# Patient Record
Sex: Male | Born: 1970
Health system: Southern US, Community
[De-identification: ages and names within clinical notes are randomized; demographics above are authoritative.]

## PROBLEM LIST (undated history)

## (undated) DIAGNOSIS — R079 Chest pain, unspecified: Secondary | ICD-10-CM

## (undated) DIAGNOSIS — K208 Other esophagitis: Secondary | ICD-10-CM

## (undated) DIAGNOSIS — K219 Gastro-esophageal reflux disease without esophagitis: Secondary | ICD-10-CM

## (undated) DIAGNOSIS — E669 Obesity, unspecified: Secondary | ICD-10-CM

## (undated) DIAGNOSIS — N529 Male erectile dysfunction, unspecified: Secondary | ICD-10-CM

## (undated) DIAGNOSIS — G4733 Obstructive sleep apnea (adult) (pediatric): Secondary | ICD-10-CM

## (undated) DIAGNOSIS — E119 Type 2 diabetes mellitus without complications: Secondary | ICD-10-CM

## (undated) DIAGNOSIS — E785 Hyperlipidemia, unspecified: Secondary | ICD-10-CM

## (undated) DIAGNOSIS — I10 Essential (primary) hypertension: Secondary | ICD-10-CM

## (undated) DIAGNOSIS — G8929 Other chronic pain: Secondary | ICD-10-CM

## (undated) HISTORY — DX: Male erectile dysfunction, unspecified: N52.9

## (undated) HISTORY — DX: Type 2 diabetes mellitus without complications: E11.9

## (undated) HISTORY — DX: Chest pain, unspecified: R07.9

## (undated) HISTORY — DX: Gastro-esophageal reflux disease without esophagitis: K21.9

## (undated) HISTORY — DX: Hyperlipidemia, unspecified: E78.5

## (undated) HISTORY — DX: Obstructive sleep apnea (adult) (pediatric): G47.33

## (undated) HISTORY — DX: Other chronic pain: G89.29

## (undated) HISTORY — PX: ANTERIOR CRUCIATE LIGAMENT REPAIR: SHX115

## (undated) HISTORY — DX: Other esophagitis: K20.8

## (undated) HISTORY — DX: Obesity, unspecified: E66.9

## (undated) HISTORY — DX: Essential (primary) hypertension: I10

---

## 2000-12-30 ENCOUNTER — Emergency Department (HOSPITAL_COMMUNITY): Admission: EM | Admit: 2000-12-30 | Discharge: 2000-12-30 | Payer: Self-pay | Admitting: Emergency Medicine

## 2000-12-30 ENCOUNTER — Encounter: Payer: Self-pay | Admitting: Emergency Medicine

## 2001-01-10 ENCOUNTER — Encounter: Payer: Self-pay | Admitting: Internal Medicine

## 2001-01-16 ENCOUNTER — Ambulatory Visit (HOSPITAL_COMMUNITY): Admission: RE | Admit: 2001-01-16 | Discharge: 2001-01-16 | Payer: Self-pay | Admitting: Gastroenterology

## 2001-03-07 ENCOUNTER — Encounter: Payer: Self-pay | Admitting: Gastroenterology

## 2001-03-07 ENCOUNTER — Ambulatory Visit (HOSPITAL_COMMUNITY): Admission: RE | Admit: 2001-03-07 | Discharge: 2001-03-07 | Payer: Self-pay | Admitting: Gastroenterology

## 2001-03-13 ENCOUNTER — Emergency Department (HOSPITAL_COMMUNITY): Admission: EM | Admit: 2001-03-13 | Discharge: 2001-03-13 | Payer: Self-pay | Admitting: Emergency Medicine

## 2001-03-15 ENCOUNTER — Ambulatory Visit (HOSPITAL_COMMUNITY): Admission: RE | Admit: 2001-03-15 | Discharge: 2001-03-15 | Payer: Self-pay | Admitting: *Deleted

## 2001-03-15 ENCOUNTER — Encounter (INDEPENDENT_AMBULATORY_CARE_PROVIDER_SITE_OTHER): Payer: Self-pay | Admitting: Specialist

## 2001-06-29 ENCOUNTER — Encounter: Payer: Self-pay | Admitting: Internal Medicine

## 2001-06-29 ENCOUNTER — Ambulatory Visit (HOSPITAL_COMMUNITY): Admission: RE | Admit: 2001-06-29 | Discharge: 2001-06-29 | Payer: Self-pay | Admitting: Internal Medicine

## 2001-11-05 ENCOUNTER — Encounter (INDEPENDENT_AMBULATORY_CARE_PROVIDER_SITE_OTHER): Payer: Self-pay | Admitting: Internal Medicine

## 2001-11-05 ENCOUNTER — Ambulatory Visit (HOSPITAL_COMMUNITY): Admission: RE | Admit: 2001-11-05 | Discharge: 2001-11-05 | Payer: Self-pay | Admitting: Internal Medicine

## 2005-03-01 ENCOUNTER — Ambulatory Visit: Payer: Self-pay | Admitting: Cardiology

## 2005-03-09 ENCOUNTER — Ambulatory Visit: Payer: Self-pay | Admitting: Cardiology

## 2005-03-10 ENCOUNTER — Ambulatory Visit: Payer: Self-pay | Admitting: Cardiology

## 2006-03-09 HISTORY — PX: CHOLECYSTECTOMY: SHX55

## 2006-07-03 ENCOUNTER — Ambulatory Visit: Payer: Self-pay | Admitting: Internal Medicine

## 2006-07-03 LAB — CONVERTED CEMR LAB
ALT: 132 units/L — ABNORMAL HIGH (ref 0–40)
AST: 76 units/L — ABNORMAL HIGH (ref 0–37)
Albumin: 4.1 g/dL (ref 3.5–5.2)
Alkaline Phosphatase: 119 units/L — ABNORMAL HIGH (ref 39–117)
BUN: 10 mg/dL (ref 6–23)
Basophils Absolute: 0 10*3/uL (ref 0.0–0.1)
Basophils Relative: 0.6 % (ref 0.0–1.0)
Bilirubin, Direct: 0.2 mg/dL (ref 0.0–0.3)
CO2: 32 meq/L (ref 19–32)
Calcium: 9.6 mg/dL (ref 8.4–10.5)
Chloride: 100 meq/L (ref 96–112)
Cholesterol: 176 mg/dL (ref 0–200)
Creatinine, Ser: 0.9 mg/dL (ref 0.4–1.5)
Direct LDL: 108.5 mg/dL
Eosinophils Absolute: 0.1 10*3/uL (ref 0.0–0.6)
Eosinophils Relative: 2.3 % (ref 0.0–5.0)
GFR calc Af Amer: 122 mL/min
GFR calc non Af Amer: 101 mL/min
Glucose, Bld: 315 mg/dL — ABNORMAL HIGH (ref 70–99)
HCT: 46.9 % (ref 39.0–52.0)
HDL: 34.2 mg/dL — ABNORMAL LOW (ref >39.0)
Hemoglobin: 16.5 g/dL (ref 13.0–17.0)
Hgb A1c MFr Bld: 10.2 % — ABNORMAL HIGH (ref 4.6–6.0)
Lymphocytes Relative: 32.8 % (ref 12.0–46.0)
MCHC: 35.1 g/dL (ref 30.0–36.0)
MCV: 88.2 fL (ref 78.0–100.0)
Monocytes Absolute: 0.5 10*3/uL (ref 0.2–0.7)
Monocytes Relative: 9.3 % (ref 3.0–11.0)
Neutro Abs: 3.4 10*3/uL (ref 1.4–7.7)
Neutrophils Relative %: 55 % (ref 43.0–77.0)
Platelets: 151 10*3/uL (ref 150–400)
Potassium: 4.1 meq/L (ref 3.5–5.1)
RBC: 5.32 M/uL (ref 4.22–5.81)
RDW: 12.1 % (ref 11.5–14.6)
Sodium: 141 meq/L (ref 135–145)
TSH: 3.23 microintl units/mL (ref 0.35–5.50)
Total Bilirubin: 1.5 mg/dL — ABNORMAL HIGH (ref 0.3–1.2)
Total CHOL/HDL Ratio: 5.1
Total Protein: 7.9 g/dL (ref 6.0–8.3)
Triglycerides: 255 mg/dL (ref 0–149)
VLDL: 51 mg/dL — ABNORMAL HIGH (ref 0–40)
WBC: 5.9 10*3/uL (ref 4.5–10.5)

## 2006-07-10 ENCOUNTER — Ambulatory Visit: Payer: Self-pay | Admitting: Internal Medicine

## 2006-07-31 ENCOUNTER — Ambulatory Visit: Payer: Self-pay | Admitting: Internal Medicine

## 2006-10-10 ENCOUNTER — Ambulatory Visit: Payer: Self-pay | Admitting: Internal Medicine

## 2006-10-10 LAB — CONVERTED CEMR LAB
BUN: 15 mg/dL (ref 6–23)
CO2: 29 meq/L (ref 19–32)
Calcium: 9.2 mg/dL (ref 8.4–10.5)
Chloride: 106 meq/L (ref 96–112)
Creatinine, Ser: 1.1 mg/dL (ref 0.4–1.5)
Creatinine,U: 140.3 mg/dL
GFR calc Af Amer: 97 mL/min
GFR calc non Af Amer: 80 mL/min
Glucose, Bld: 94 mg/dL (ref 70–99)
Hgb A1c MFr Bld: 5.5 % (ref 4.6–6.0)
Microalb Creat Ratio: 5 mg/g (ref 0.0–30.0)
Microalb, Ur: 0.7 mg/dL (ref 0.0–1.9)
Potassium: 4.2 meq/L (ref 3.5–5.1)
Sodium: 139 meq/L (ref 135–145)

## 2006-12-19 DIAGNOSIS — K208 Other esophagitis without bleeding: Secondary | ICD-10-CM

## 2006-12-19 DIAGNOSIS — K219 Gastro-esophageal reflux disease without esophagitis: Secondary | ICD-10-CM | POA: Insufficient documentation

## 2006-12-19 HISTORY — DX: Other esophagitis without bleeding: K20.80

## 2007-01-11 ENCOUNTER — Ambulatory Visit: Payer: Self-pay | Admitting: Internal Medicine

## 2007-04-27 ENCOUNTER — Ambulatory Visit: Payer: Self-pay | Admitting: Internal Medicine

## 2007-04-27 LAB — CONVERTED CEMR LAB
Cholesterol: 170 mg/dL (ref 0–200)
HDL: 26.4 mg/dL — ABNORMAL LOW (ref >39.0)
Hgb A1c MFr Bld: 5.1 % (ref 4.6–6.0)
LDL Cholesterol: 110 mg/dL — ABNORMAL HIGH (ref 0–99)
Total CHOL/HDL Ratio: 6.4
Triglycerides: 166 mg/dL — ABNORMAL HIGH (ref 0–149)
VLDL: 33 mg/dL (ref 0–40)

## 2007-06-01 ENCOUNTER — Ambulatory Visit: Payer: Self-pay | Admitting: Internal Medicine

## 2007-10-03 ENCOUNTER — Ambulatory Visit: Payer: Self-pay | Admitting: Internal Medicine

## 2007-10-03 LAB — CONVERTED CEMR LAB: Hgb A1c MFr Bld: 5.5 % (ref 4.6–6.0)

## 2007-11-02 ENCOUNTER — Ambulatory Visit: Payer: Self-pay | Admitting: Internal Medicine

## 2007-11-02 DIAGNOSIS — L723 Sebaceous cyst: Secondary | ICD-10-CM | POA: Insufficient documentation

## 2007-11-02 DIAGNOSIS — G4733 Obstructive sleep apnea (adult) (pediatric): Secondary | ICD-10-CM

## 2007-11-02 HISTORY — DX: Obstructive sleep apnea (adult) (pediatric): G47.33

## 2009-07-20 ENCOUNTER — Ambulatory Visit: Payer: Self-pay | Admitting: Internal Medicine

## 2009-07-20 LAB — CONVERTED CEMR LAB
ALT: 63 units/L — ABNORMAL HIGH (ref 0–53)
AST: 35 units/L (ref 0–37)
Albumin: 4.1 g/dL (ref 3.5–5.2)
Alkaline Phosphatase: 90 units/L (ref 39–117)
BUN: 11 mg/dL (ref 6–23)
Basophils Absolute: 0 10*3/uL (ref 0.0–0.1)
Basophils Relative: 0.7 % (ref 0.0–3.0)
Bilirubin Urine: NEGATIVE
Bilirubin, Direct: 0.2 mg/dL (ref 0.0–0.3)
CO2: 30 meq/L (ref 19–32)
Calcium: 9.3 mg/dL (ref 8.4–10.5)
Chloride: 108 meq/L (ref 96–112)
Cholesterol: 146 mg/dL (ref 0–200)
Creatinine, Ser: 1 mg/dL (ref 0.4–1.5)
Eosinophils Absolute: 0.1 10*3/uL (ref 0.0–0.7)
Eosinophils Relative: 2.6 % (ref 0.0–5.0)
GFR calc non Af Amer: 87.68 mL/min (ref >60)
Glucose, Bld: 170 mg/dL — ABNORMAL HIGH (ref 70–99)
HCT: 43.1 % (ref 39.0–52.0)
HDL: 36.8 mg/dL — ABNORMAL LOW (ref >39.00)
Hemoglobin, Urine: NEGATIVE
Hemoglobin: 14.7 g/dL (ref 13.0–17.0)
Ketones, ur: NEGATIVE mg/dL
LDL Cholesterol: 78 mg/dL (ref 0–99)
Leukocytes, UA: NEGATIVE
Lymphocytes Relative: 30.5 % (ref 12.0–46.0)
Lymphs Abs: 1.6 10*3/uL (ref 0.7–4.0)
MCHC: 34.1 g/dL (ref 30.0–36.0)
MCV: 90.1 fL (ref 78.0–100.0)
Monocytes Absolute: 0.5 10*3/uL (ref 0.1–1.0)
Monocytes Relative: 10.6 % (ref 3.0–12.0)
Neutro Abs: 2.9 10*3/uL (ref 1.4–7.7)
Neutrophils Relative %: 55.6 % (ref 43.0–77.0)
Nitrite: NEGATIVE
PSA: 0.2 ng/mL (ref 0.10–4.00)
Platelets: 147 10*3/uL — ABNORMAL LOW (ref 150.0–400.0)
Potassium: 4.3 meq/L (ref 3.5–5.1)
RBC: 4.78 M/uL (ref 4.22–5.81)
RDW: 12.7 % (ref 11.5–14.6)
Sodium: 141 meq/L (ref 135–145)
Specific Gravity, Urine: <=1.005 (ref 1.000–1.030)
TSH: 2.92 microintl units/mL (ref 0.35–5.50)
Total Bilirubin: 0.8 mg/dL (ref 0.3–1.2)
Total CHOL/HDL Ratio: 4
Total Protein, Urine: NEGATIVE mg/dL
Total Protein: 7.7 g/dL (ref 6.0–8.3)
Triglycerides: 157 mg/dL — ABNORMAL HIGH (ref 0.0–149.0)
Urine Glucose: NEGATIVE mg/dL
Urobilinogen, UA: 0.2 (ref 0.0–1.0)
VLDL: 31.4 mg/dL (ref 0.0–40.0)
WBC: 5.1 10*3/uL (ref 4.5–10.5)
pH: 6 (ref 5.0–8.0)

## 2009-07-27 ENCOUNTER — Ambulatory Visit: Payer: Self-pay | Admitting: Internal Medicine

## 2009-07-27 DIAGNOSIS — E118 Type 2 diabetes mellitus with unspecified complications: Secondary | ICD-10-CM

## 2009-07-27 DIAGNOSIS — E1165 Type 2 diabetes mellitus with hyperglycemia: Secondary | ICD-10-CM

## 2009-07-27 DIAGNOSIS — E119 Type 2 diabetes mellitus without complications: Secondary | ICD-10-CM

## 2009-07-27 DIAGNOSIS — E785 Hyperlipidemia, unspecified: Secondary | ICD-10-CM | POA: Insufficient documentation

## 2009-07-27 HISTORY — DX: Type 2 diabetes mellitus without complications: E11.9

## 2009-07-27 HISTORY — DX: Type 2 diabetes mellitus with hyperglycemia: E11.65

## 2009-08-11 LAB — HM DIABETES EYE EXAM: HM Diabetic Eye Exam: NORMAL

## 2009-09-21 ENCOUNTER — Ambulatory Visit: Payer: Self-pay | Admitting: Internal Medicine

## 2009-09-21 LAB — CONVERTED CEMR LAB: Blood Glucose, Fingerstick: 175

## 2009-11-02 ENCOUNTER — Ambulatory Visit: Payer: Self-pay | Admitting: Internal Medicine

## 2009-11-02 LAB — CONVERTED CEMR LAB: Hgb A1c MFr Bld: 6.8 % — ABNORMAL HIGH (ref 4.6–6.5)

## 2009-11-05 ENCOUNTER — Ambulatory Visit: Payer: Self-pay | Admitting: Internal Medicine

## 2009-12-22 ENCOUNTER — Telehealth: Payer: Self-pay | Admitting: Internal Medicine

## 2010-01-28 ENCOUNTER — Ambulatory Visit: Payer: Self-pay | Admitting: Internal Medicine

## 2010-02-04 ENCOUNTER — Ambulatory Visit: Payer: Self-pay | Admitting: Internal Medicine

## 2010-02-04 LAB — HM DIABETES FOOT EXAM

## 2010-02-18 DIAGNOSIS — E669 Obesity, unspecified: Secondary | ICD-10-CM

## 2010-03-31 ENCOUNTER — Ambulatory Visit: Payer: Self-pay | Admitting: Internal Medicine

## 2010-03-31 LAB — CONVERTED CEMR LAB
BUN: 10 mg/dL (ref 6–23)
Chloride: 101 meq/L (ref 96–112)
Microalb, Ur: 0.6 mg/dL (ref 0.0–1.9)
Potassium: 4.1 meq/L (ref 3.5–5.3)

## 2010-04-07 ENCOUNTER — Ambulatory Visit: Payer: Self-pay | Admitting: Internal Medicine

## 2010-06-02 ENCOUNTER — Ambulatory Visit (HOSPITAL_BASED_OUTPATIENT_CLINIC_OR_DEPARTMENT_OTHER): Admission: RE | Admit: 2010-06-02 | Payer: Self-pay | Source: Home / Self Care | Admitting: Internal Medicine

## 2010-06-10 NOTE — Assessment & Plan Note (Signed)
Summary: 3 month fup///ccm   Vital Signs:  Patient profile:   40 year old male Height:      74 inches Weight:      312 pounds BMI:     40.20 Temp:     98.2 degrees F oral Pulse rate:   76 / minute Resp:     14 per minute BP sitting:   146 / 90  (left arm) Cuff size:   large  Vitals Entered By: Willy Eddy, LPN (February 04, 2010 9:31 AM)  Nutrition Counseling: Patient's BMI is greater than 25 and therefore counseled on weight management options. CC: roa labs Is Patient Diabetic? Yes Did you bring your meter with you today? No  2  Primary Care Wyndell Cardiff:  Stacie Glaze MD  CC:  roa labs.  History of Present Illness: The pt is aware of the CBG's have increased in the last 3 weeks has been in his office more often anf not walking he has increasd the onglyza dose on his own to 15mg ! fortunately he has not seen  side effecs he has only be able to reach normal range cbgs with fasting he has not noted increase in GERD he has gained weight  Preventive Screening-Counseling & Management  Alcohol-Tobacco     Smoking Status: never     Tobacco Counseling: not indicated; no tobacco use  Problems Prior to Update: 1)  Acute Swimmers Ear  (ICD-380.12) 2)  Diabetes Mellitus, Type II, Controlled, Mild  (ICD-250.00) 3)  Preventive Health Care  (ICD-V70.0) 4)  Sebaceous Cyst, Infected  (ICD-706.2) 5)  Obstructive Sleep Apnea  (ICD-327.23) 6)  Family History of Cad Male 1st Degree Relative <50  (ICD-V17.3) 7)  Esophagitis Nec  (ICD-530.19)  Current Problems (verified): 1)  Acute Swimmers Ear  (ICD-380.12) 2)  Diabetes Mellitus, Type II, Controlled, Mild  (ICD-250.00) 3)  Preventive Health Care  (ICD-V70.0) 4)  Sebaceous Cyst, Infected  (ICD-706.2) 5)  Obstructive Sleep Apnea  (ICD-327.23) 6)  Family History of Cad Male 1st Degree Relative <50  (ICD-V17.3) 7)  Esophagitis Nec  (ICD-530.19)  Medications Prior to Update: 1)  Prilosec Otc 20 Mg  Tbec (Omeprazole Magnesium)  .... Once Daily 2)  Accu-Chek Aviva  Strp (Glucose Blood) .... Test Daily As Directed 3)  Onglyza 5 Mg Tabs (Saxagliptin Hcl) .... One By Mouth Daily  Current Medications (verified): 1)  Prilosec Otc 20 Mg  Tbec (Omeprazole Magnesium) .... Once Daily As Needed 2)  Accu-Chek Aviva  Strp (Glucose Blood) .... Test Daily As Directed 3)  Kombiglyze Xr 09-998 Mg Xr24h-Tab (Saxagliptin-Metformin) .... One By Mouth Daily  Allergies (verified): No Known Drug Allergies  Contraindications/Deferment of Procedures/Staging:    Test/Procedure: FLU VAX    Reason for deferment: patient declined   Past History:  Family History: Last updated: 01/11/2007 Family History of CAD Male 1st degree relative <50  Social History: Last updated: 01/11/2007 Divorced Never Smoked  Risk Factors: Exercise: yes (07/27/2009)  Risk Factors: Smoking Status: never (02/04/2010)  Past medical, surgical, family and social histories (including risk factors) reviewed, and no changes noted (except as noted below).  Past Medical History: Reviewed history from 12/19/2006 and no changes required. erosive esophgus Diabetes mellitus, type II chronic chest pain  Past Surgical History: Reviewed history from 12/19/2006 and no changes required. Cholecystectomy  11/07  Family History: Reviewed history from 01/11/2007 and no changes required. Family History of CAD Male 1st degree relative <50  Social History: Reviewed history from 01/11/2007 and no changes  required. Divorced Never Smoked  Review of Systems  The patient denies anorexia, fever, weight loss, weight gain, vision loss, decreased hearing, hoarseness, chest pain, syncope, dyspnea on exertion, peripheral edema, prolonged cough, headaches, hemoptysis, abdominal pain, melena, hematochezia, severe indigestion/heartburn, hematuria, incontinence, genital sores, muscle weakness, suspicious skin lesions, transient blindness, difficulty walking, depression, unusual  weight change, abnormal bleeding, enlarged lymph nodes, angioedema, and breast masses.    Physical Exam  General:  alert and overweight-appearing.   Head:  normocephalic and atraumatic.   Eyes:  pupils equal and pupils round.   Ears:  R cerumen impaction and R TM erythema.   Nose:  no external deformity and no nasal discharge.   Mouth:  pharynx pink and moist.   Neck:  supple.   Lungs:  Normal respiratory effort, chest expands symmetrically. Lungs are clear to auscultation, no crackles or wheezes. Heart:  Normal rate and regular rhythm. S1 and S2 normal without gallop, murmur, click, rub or other extra sounds. Abdomen:  soft and non-tender.   Msk:  No deformity or scoliosis noted of thoracic or lumbar spine.    Diabetes Management Exam:    Foot Exam (with socks and/or shoes not present):       Sensory-Pinprick/Light touch:          Left medial foot (L-4): normal          Left dorsal foot (L-5): normal          Left lateral foot (S-1): normal          Right medial foot (L-4): normal          Right dorsal foot (L-5): normal          Right lateral foot (S-1): normal       Sensory-Monofilament:          Left foot: normal          Right foot: normal       Inspection:          Left foot: normal          Right foot: normal       Nails:          Left foot: normal          Right foot: normal   Impression & Recommendations:  Problem # 1:  DIABETES MELLITUS, TYPE II, CONTROLLED, MILD (ICD-250.00) pt has fatigue with elevated CBGs and realizes the nedd for exercize The following medications were removed from the medication list:    Onglyza 5 Mg Tabs (Saxagliptin hcl) ..... One by mouth daily His updated medication list for this problem includes:    Kombiglyze Xr 09-998 Mg Xr24h-tab (Saxagliptin-metformin) ..... One by mouth daily  Labs Reviewed: Creat: 1.0 (07/20/2009)    Reviewed HgBA1c results: 8.1 (01/28/2010)  6.8 (11/02/2009)  Problem # 2:  OBSTRUCTIVE SLEEP APNEA  (ICD-327.23) exercize needed  Problem # 3:  WEIGHT GAIN (ICD-783.1) reviewed diet  Complete Medication List: 1)  Prilosec Otc 20 Mg Tbec (Omeprazole magnesium) .... Once daily as needed 2)  Accu-chek Aviva Strp (Glucose blood) .... Test daily as directed 3)  Kombiglyze Xr 09-998 Mg Xr24h-tab (Saxagliptin-metformin) .... One by mouth daily  Patient Instructions: 1)  Please schedule a follow-up appointment in 2 months. 2)  HbgA1C prior to visit, ICD-9: 250.00 3)  BMP prior to visit, ICD-9:250.00 4)  Urine Microalbumin prior to visit, ICD-9:250.00

## 2010-06-10 NOTE — Progress Notes (Signed)
Summary: new rx needed  Phone Note Call from Patient Call back at Home Phone [phone]   Caller: Patient-----live call Summary of Call: Need another rx for his glucose med. pt cannot remember what the rx is. wants [facility] to return call. [facility] [place] Initial call taken by: Warnell Forester,  December 22, 2009 10:52 AM  Follow-up for Phone Call        pplease let pt know it has been done Follow-up by: Willy Eddy, LPN,  December 22, 2009 10:54 AM  Additional Follow-up for Phone Call Additional follow up Details #1::        done. Additional Follow-up by: Warnell Forester,  December 22, 2009 11:02 AM    Prescriptions: ONGLYZA 5 MG TABS (SAXAGLIPTIN HCL) one by mouth daily  #30 x 6   Entered by:   Willy Eddy, LPN   Authorized by:   Stacie Glaze MD   Signed by:   Willy Eddy, LPN on 30/86/5784   Method used:   [facility]        [facility]  [address] [phone][facility] (retail)       [address]       [place], [place]  [address]       Ph: [phone]       Fax: [phone]   RxID:   [id number]

## 2010-06-10 NOTE — Assessment & Plan Note (Signed)
Summary: 2 morov/mm   Vital Signs:  Patient profile:   40 year old male Height:      74 inches Weight:      318 pounds BMI:     40.98 Temp:     98.2 degrees F oral Pulse rate:   76 / minute Resp:     14 per minute BP sitting:   136 / 90  (left arm) Cuff size:   large  Vitals Entered By: Willy Eddy, LPN (April 07, 2010 10:11 AM) CC: roa labs-fbs are running around 118, Hypertension Management, Type 2 diabetes mellitus follow-up Is Patient Diabetic? Yes Did you bring your meter with you today? No   Primary Care Provider:  Stacie Glaze MD  CC:  roa labs-fbs are running around 118, Hypertension Management, and Type 2 diabetes mellitus follow-up.  History of Present Illness:  Hypertension Follow-Up      This is a 40 year old man who presents for Hypertension follow-up.  The patient denies lightheadedness, urinary frequency, headaches, edema, impotence, rash, and fatigue.  The patient denies the following associated symptoms: chest pain, chest pressure, exercise intolerance, dyspnea, palpitations, syncope, leg edema, and pedal edema.  Compliance with medications (by patient report) has been near 100%.  The patient reports that dietary compliance has been good.  The patient reports exercising 3-4X per week.    Type 2 Diabetes Mellitus Follow-Up      The patient is also here for Type 2 diabetes mellitus follow-up.  The patient denies polyuria, polydipsia, blurred vision, self managed hypoglycemia, hypoglycemia requiring help, weight loss, weight gain, and numbness of extremities.  The patient denies the following symptoms: neuropathic pain, chest pain, vomiting, orthostatic symptoms, poor wound healing, intermittent claudication, vision loss, and foot ulcer.  Since the last visit the patient reports good dietary compliance.  The patient has been measuring capillary blood glucose before breakfast.  Since the last visit, the patient reports having had eye care by an ophthalmologist.     Hypertension History:      Positive major cardiovascular risk factors include diabetes.  Negative major cardiovascular risk factors include male age less than 25 years old and non-tobacco-user status.     Preventive Screening-Counseling & Management  Alcohol-Tobacco     Smoking Status: never     Tobacco Counseling: not indicated; no tobacco use  Problems Prior to Update: 1)  Weight Gain  (ICD-783.1) 2)  Acute Swimmers Ear  (ICD-380.12) 3)  Diabetes Mellitus, Type II, Controlled, Mild  (ICD-250.00) 4)  Preventive Health Care  (ICD-V70.0) 5)  Sebaceous Cyst, Infected  (ICD-706.2) 6)  Obstructive Sleep Apnea  (ICD-327.23) 7)  Family History of Cad Male 1st Degree Relative <50  (ICD-V17.3) 8)  Esophagitis Nec  (ICD-530.19)  Current Problems (verified): 1)  Weight Gain  (ICD-783.1) 2)  Acute Swimmers Ear  (ICD-380.12) 3)  Diabetes Mellitus, Type II, Controlled, Mild  (ICD-250.00) 4)  Preventive Health Care  (ICD-V70.0) 5)  Sebaceous Cyst, Infected  (ICD-706.2) 6)  Obstructive Sleep Apnea  (ICD-327.23) 7)  Family History of Cad Male 1st Degree Relative <50  (ICD-V17.3) 8)  Esophagitis Nec  (ICD-530.19)  Medications Prior to Update: 1)  Prilosec Otc 20 Mg  Tbec (Omeprazole Magnesium) .... Once Daily As Needed 2)  Accu-Chek Aviva  Strp (Glucose Blood) .... Test Daily As Directed 3)  Kombiglyze Xr 09-998 Mg Xr24h-Tab (Saxagliptin-Metformin) .... One By Mouth Daily  Current Medications (verified): 1)  Prilosec Otc 20 Mg  Tbec (Omeprazole Magnesium) .... Once  Daily As Needed 2)  Accu-Chek Aviva  Strp (Glucose Blood) .... Test Daily As Directed 3)  Kombiglyze Xr 09-998 Mg Xr24h-Tab (Saxagliptin-Metformin) .... One By Mouth Daily  Allergies (verified): No Known Drug Allergies  Past History:  Family History: Last updated: 01/11/2007 Family History of CAD Male 1st degree relative <50  Social History: Last updated: 01/11/2007 Divorced Never Smoked  Risk Factors: Exercise:  yes (07/27/2009)  Risk Factors: Smoking Status: never (04/07/2010)  Past medical, surgical, family and social histories (including risk factors) reviewed, and no changes noted (except as noted below).  Past Medical History: Reviewed history from 12/19/2006 and no changes required. erosive esophgus Diabetes mellitus, type II chronic chest pain  Past Surgical History: Reviewed history from 12/19/2006 and no changes required. Cholecystectomy  11/07  Family History: Reviewed history from 01/11/2007 and no changes required. Family History of CAD Male 1st degree relative <50  Social History: Reviewed history from 01/11/2007 and no changes required. Divorced Never Smoked  Review of Systems  The patient denies anorexia, fever, weight loss, weight gain, vision loss, decreased hearing, hoarseness, chest pain, syncope, dyspnea on exertion, peripheral edema, prolonged cough, headaches, hemoptysis, abdominal pain, melena, hematochezia, severe indigestion/heartburn, hematuria, incontinence, genital sores, muscle weakness, suspicious skin lesions, transient blindness, difficulty walking, depression, unusual weight change, abnormal bleeding, enlarged lymph nodes, angioedema, and breast masses.    Physical Exam  General:  alert and overweight-appearing.   Head:  normocephalic and atraumatic.   Eyes:  pupils equal and pupils round.   Ears:  R cerumen impaction and R TM erythema.   Nose:  no external deformity and no nasal discharge.   Mouth:  pharynx pink and moist.   Neck:  supple.   Lungs:  Normal respiratory effort, chest expands symmetrically. Lungs are clear to auscultation, no crackles or wheezes. Heart:  Normal rate and regular rhythm. S1 and S2 normal without gallop, murmur, click, rub or other extra sounds. Abdomen:  soft and non-tender.   Msk:  No deformity or scoliosis noted of thoracic or lumbar spine.   Neurologic:  No cranial nerve deficits noted. Station and gait are normal.  Plantar reflexes are down-going bilaterally. DTRs are symmetrical throughout. Sensory, motor and coordinative functions appear intact.  Diabetes Management Exam:    Eye Exam:       Eye Exam done elsewhere          Date: 08/11/2009          Results: normal          Done by: [facility]   Impression & Recommendations:  Problem # 1:  WEIGHT GAIN (ICD-783.1) the pt gained weight over the thanksgiving holiday this is still a mian goal for him of weight loss of 20 lbs  Problem # 2:  DIABETES MELLITUS, TYPE II, CONTROLLED, MILD (ICD-250.00)  His updated medication list for this problem includes:    Kombiglyze Xr 09-998 Mg Xr24h-tab (Saxagliptin-metformin) ..... One by mouth daily  Labs Reviewed: Creat: 0.96 (03/31/2010)    Reviewed HgBA1c results: 7.4 (03/31/2010)  8.1 (01/28/2010)  Problem # 3:  OBSTRUCTIVE SLEEP APNEA (ICD-327.23)  daytime excessive sleep ???apnea  Orders: Sleep Disorder Referral (Sleep Disorder)  Complete Medication List: 1)  Prilosec Otc 20 Mg Tbec (Omeprazole magnesium) .... Once daily as needed 2)  Accu-chek Aviva Strp (Glucose blood) .... Test daily as directed 3)  Kombiglyze Xr 09-998 Mg Xr24h-tab (Saxagliptin-metformin) .... One by mouth daily  Hypertension Assessment/Plan:      The patient's hypertensive risk group is  category C: Target organ damage and/or diabetes.  His calculated 10 year risk of coronary heart disease is 6 %.  Today's blood pressure is 136/90.  His blood pressure goal is < 130/80.  Patient Instructions: 1)  Please schedule a follow-up appointment in 4 months. 2)  HbgA1C prior to visit, ICD-9:250.82 Prescriptions: KOMBIGLYZE XR 09-998 MG XR24H-TAB (SAXAGLIPTIN-METFORMIN) one by mouth daily  #30 x 11   Entered and Authorized by:   Stacie Glaze MD   Signed by:   Stacie Glaze MD on 04/07/2010   Method used:   [facility]  [address] [phone][facility]ail[facility]       [address]       [place], [place]   [address]       Ph: [phone]       Fax: [phone]   RxID:   [id number]    Orders Added: 1)  Sleep Disorder Referral [Sleep Disorder] 2)  Est. Patient Level IV [24401]

## 2010-06-10 NOTE — Assessment & Plan Note (Signed)
Summary: 7 wk rov/njr   Vital Signs:  Patient profile:   40 year old male Height:      74 inches Weight:      316 pounds Temp:     98.2 degrees F oral Pulse rate:   80 / minute Resp:     14 per minute BP sitting:   144 / 90  (left arm) Cuff size:   large  Vitals Entered By: Willy Eddy, LPN (November 05, 2009 10:40 AM) CC: roa labs, Type 2 diabetes mellitus follow-up Is Patient Diabetic? Yes Did you bring your meter with you today? No   Primary Care Provider:  Stacie Glaze MD  CC:  roa labs and Type 2 diabetes mellitus follow-up.  History of Present Illness: DM follow up  Type 2 Diabetes Mellitus Follow-Up      This is a 40 year old man who presents for Type 2 diabetes mellitus follow-up.  The patient denies polyuria, polydipsia, blurred vision, self managed hypoglycemia, hypoglycemia requiring help, weight loss, weight gain, and numbness of extremities.  The patient denies the following symptoms: neuropathic pain, chest pain, vomiting, orthostatic symptoms, poor wound healing, intermittent claudication, vision loss, and foot ulcer.   slight bump up in the a1c the blood glucoses are note controlled as well as they were before stopped the caffine and this helped has not been travelling as much  Diabetes Management History:      The patient is a 40 years old male old male who comes in for evaluation of DM Type 2.  He has not been enrolled in the "Diabetic Education Program".  He is checking home blood sugars.  He says that he is exercising.  Type of exercise includes: walking.  Duration of exercise is estimated to be 30 min.  He is doing this 3 times per week.        There are no symptoms to suggest diabetic complications.  No changes have been made to his treatment plan since last visit.    Preventive Screening-Counseling & Management  Alcohol-Tobacco     Smoking Status: never  Problems Prior to Update: 1)  Acute Swimmers Ear  (ICD-380.12) 2)  Diabetes Mellitus, Type II,  Controlled, Mild  (ICD-250.00) 3)  Preventive Health Care  (ICD-V70.0) 4)  Sebaceous Cyst, Infected  (ICD-706.2) 5)  Obstructive Sleep Apnea  (ICD-327.23) 6)  Family History of Cad Male 1st Degree Relative <50  (ICD-V17.3) 7)  Esophagitis Nec  (ICD-530.19)  Current Problems (verified): 1)  Acute Swimmers Ear  (ICD-380.12) 2)  Diabetes Mellitus, Type II, Controlled, Mild  (ICD-250.00) 3)  Preventive Health Care  (ICD-V70.0) 4)  Sebaceous Cyst, Infected  (ICD-706.2) 5)  Obstructive Sleep Apnea  (ICD-327.23) 6)  Family History of Cad Male 1st Degree Relative <50  (ICD-V17.3) 7)  Esophagitis Nec  (ICD-530.19)  Medications Prior to Update: 1)  Prilosec Otc 20 Mg  Tbec (Omeprazole Magnesium) .... Once Daily 2)  Accu-Chek Aviva  Strp (Glucose Blood) .... Test Daily As Directed 3)  Onglyza 5 Mg Tabs (Saxagliptin Hcl) .... One By Mouth Daily 4)  Neomycin-Polymyxin-Hc 3.5-10000-1 Soln (Neomycin-Polymyxin-Hc) .... 5 Drops in Right Ear 4 Times A Day For 7 Days  Current Medications (verified): 1)  Prilosec Otc 20 Mg  Tbec (Omeprazole Magnesium) .... Once Daily 2)  Accu-Chek Aviva  Strp (Glucose Blood) .... Test Daily As Directed 3)  Onglyza 5 Mg Tabs (Saxagliptin Hcl) .... One By Mouth Daily  Past History:  Family History: Last updated: 01/11/2007 Family History  of CAD Male 1st degree relative <50  Social History: Last updated: 01/11/2007 Divorced Never Smoked  Risk Factors: Exercise: yes (07/27/2009)  Risk Factors: Smoking Status: never (11/05/2009)  Past medical, surgical, family and social histories (including risk factors) reviewed, and no changes noted (except as noted below).  Past Medical History: Reviewed history from 12/19/2006 and no changes required. erosive esophgus Diabetes mellitus, type II chronic chest pain  Past Surgical History: Reviewed history from 12/19/2006 and no changes required. Cholecystectomy  11/07  Family History: Reviewed history from  01/11/2007 and no changes required. Family History of CAD Male 1st degree relative <50  Social History: Reviewed history from 01/11/2007 and no changes required. Divorced Never Smoked  Review of Systems  The patient denies anorexia, fever, weight loss, weight gain, vision loss, decreased hearing, hoarseness, chest pain, syncope, dyspnea on exertion, peripheral edema, prolonged cough, headaches, hemoptysis, abdominal pain, melena, hematochezia, severe indigestion/heartburn, hematuria, incontinence, genital sores, muscle weakness, suspicious skin lesions, transient blindness, difficulty walking, depression, unusual weight change, abnormal bleeding, enlarged lymph nodes, angioedema, breast masses, and testicular masses.    Physical Exam  General:  alert and overweight-appearing.   Head:  normocephalic and atraumatic.   Eyes:  pupils equal and pupils round.   Ears:  R cerumen impaction and R TM erythema.   Nose:  no external deformity and no nasal discharge.   Mouth:  pharynx pink and moist.   Neck:  supple.   Lungs:  Normal respiratory effort, chest expands symmetrically. Lungs are clear to auscultation, no crackles or wheezes. Heart:  Normal rate and regular rhythm. S1 and S2 normal without gallop, murmur, click, rub or other extra sounds. Abdomen:  soft and non-tender.   Msk:  No deformity or scoliosis noted of thoracic or lumbar spine.   Extremities:  No clubbing, cyanosis, edema, or deformity noted with normal full range of motion of all joints.   Neurologic:  No cranial nerve deficits noted. Station and gait are normal. Plantar reflexes are down-going bilaterally. DTRs are symmetrical throughout. Sensory, motor and coordinative functions appear intact.   Impression & Recommendations:  Problem # 1:  DIABETES MELLITUS, TYPE II, CONTROLLED, MILD (ICD-250.00) I have spent greater that 30 min face to face evaluating this patient counsilling on diet and exercized and portion control His  updated medication list for this problem includes:    Onglyza 5 Mg Tabs (Saxagliptin hcl) ..... One by mouth daily  Labs Reviewed: Creat: 1.0 (07/20/2009)    Reviewed HgBA1c results: 5.5 (10/03/2007)  5.1 (04/27/2007)  Problem # 2:  OBSTRUCTIVE SLEEP APNEA (ICD-327.23) stable  Complete Medication List: 1)  Prilosec Otc 20 Mg Tbec (Omeprazole magnesium) .... Once daily 2)  Accu-chek Aviva Strp (Glucose blood) .... Test daily as directed 3)  Onglyza 5 Mg Tabs (Saxagliptin hcl) .... One by mouth daily  Patient Instructions: 1)  Please schedule a follow-up appointment in 3 months. 2)  HbgA1C prior to visit, ICD-9: 250.00

## 2010-06-10 NOTE — Assessment & Plan Note (Signed)
Summary: cpx/cjr   Vital Signs:  Patient profile:   40 year old male Height:      74 inches Weight:      312 pounds BMI:     40.20 Temp:     98.2 degrees F oral Pulse rate:   76 / minute Resp:     14 per minute BP sitting:   140 / 94  (left arm) Cuff size:   large  Vitals Entered By: Willy Eddy, LPN (July 27, 2009 8:45 AM) CC: cpx   CC:  cpx.  History of Present Illness: The pt was asked about all immunizations, health maint. services that are appropriate to their age and was given guidance on diet exercize  and weight management    Preventive Screening-Counseling & Management  Alcohol-Tobacco     Smoking Status: never  Caffeine-Diet-Exercise     Does Patient Exercise: yes  Problems Prior to Update: 1)  Sebaceous Cyst, Infected  (ICD-706.2) 2)  Obstructive Sleep Apnea  (ICD-327.23) 3)  Family History of Cad Male 1st Degree Relative <50  (ICD-V17.3) 4)  Esophagitis Nec  (ICD-530.19)  Current Problems (verified): 1)  Sebaceous Cyst, Infected  (ICD-706.2) 2)  Obstructive Sleep Apnea  (ICD-327.23) 3)  Family History of Cad Male 1st Degree Relative <50  (ICD-V17.3) 4)  Esophagitis Nec  (ICD-530.19)  Medications Prior to Update: 1)  Prilosec Otc 20 Mg  Tbec (Omeprazole Magnesium) .... Once Daily 2)  Onetouch Ultra Test   Strp (Glucose Blood) .... Use As Directed  Current Medications (verified): 1)  Prilosec Otc 20 Mg  Tbec (Omeprazole Magnesium) .... Once Daily  Allergies (verified): No Known Drug Allergies  Past History:  Family History: Last updated: 01/11/2007 Family History of CAD Male 1st degree relative <50  Social History: Last updated: 01/11/2007 Divorced Never Smoked  Risk Factors: Exercise: yes (07/27/2009)  Risk Factors: Smoking Status: never (07/27/2009)  Past medical, surgical, family and social histories (including risk factors) reviewed, and no changes noted (except as noted below).  Past Medical History: Reviewed history  from 12/19/2006 and no changes required. erosive esophgus Diabetes mellitus, type II chronic chest pain  Past Surgical History: Reviewed history from 12/19/2006 and no changes required. Cholecystectomy  11/07  Family History: Reviewed history from 01/11/2007 and no changes required. Family History of CAD Male 1st degree relative <50  Social History: Reviewed history from 01/11/2007 and no changes required. Divorced Never Smoked  Review of Systems  The patient denies anorexia, fever, weight loss, weight gain, vision loss, decreased hearing, hoarseness, chest pain, syncope, dyspnea on exertion, peripheral edema, prolonged cough, headaches, hemoptysis, abdominal pain, melena, hematochezia, severe indigestion/heartburn, hematuria, incontinence, genital sores, muscle weakness, suspicious skin lesions, transient blindness, difficulty walking, depression, unusual weight change, abnormal bleeding, enlarged lymph nodes, angioedema, breast masses, and testicular masses.    Physical Exam  General:  Well-developed,well-nourished,in no acute distress; alert,appropriate and cooperative throughout examination Head:  normocephalic.   Ears:  R ear normal, L ear normal, and no external deformities.   Nose:  no external deformity.   Mouth:  pharynx pink and moist.   Neck:  supple.   Lungs:  Normal respiratory effort, chest expands symmetrically. Lungs are clear to auscultation, no crackles or wheezes. Heart:  Normal rate and regular rhythm. S1 and S2 normal without gallop, murmur, click, rub or other extra sounds. Abdomen:  soft and non-tender.   Genitalia:  Testes bilaterally descended without nodularity, tenderness or masses. No scrotal masses or lesions. No penis lesions or  urethral discharge. Prostate:  Prostate gland firm and smooth, no enlargement, nodularity, tenderness, mass, asymmetry or induration. Msk:  No deformity or scoliosis noted of thoracic or lumbar spine.   Pulses:  R and L  carotid,radial,femoral,dorsalis pedis and posterior tibial pulses are full and equal bilaterally Extremities:  No clubbing, cyanosis, edema, or deformity noted with normal full range of motion of all joints.   Neurologic:  No cranial nerve deficits noted. Station and gait are normal. Plantar reflexes are down-going bilaterally. DTRs are symmetrical throughout. Sensory, motor and coordinative functions appear intact. Axillary Nodes:  No palpable lymphadenopathy Inguinal Nodes:  No significant adenopathy Psych:  not depressed appearing.    Diabetes Management Exam:    Foot Exam (with socks and/or shoes not present):       Sensory-Pinprick/Light touch:          Left medial foot (L-4): normal          Left dorsal foot (L-5): normal          Left lateral foot (S-1): normal          Right medial foot (L-4): normal          Right dorsal foot (L-5): normal          Right lateral foot (S-1): normal       Sensory-Monofilament:          Left foot: normal          Right foot: normal       Inspection:          Left foot: normal          Right foot: normal       Nails:          Left foot: normal          Right foot: normal   Impression & Recommendations:  Problem # 1:  PREVENTIVE HEALTH CARE (ICD-V70.0)  Td Booster: Tdap (07/27/2009)   Chol: 146 (07/20/2009)   HDL: 36.80 (07/20/2009)   LDL: 78 (07/20/2009)   TG: 157.0 (07/20/2009) TSH: 2.92 (07/20/2009)   HgbA1C: 5.5 (10/03/2007)   PSA: 0.20 (07/20/2009)  Discussed using sunscreen, use of alcohol, drug use, self testicular exam, routine dental care, routine eye care, routine physical exam, seat belts, multiple vitamins, osteoporosis prevention, adequate calcium intake in diet, and recommendations for immunizations.  Discussed exercise and checking cholesterol.  Discussed gun safety, safe sex, and contraception. Also recommend checking PSA.  Problem # 2:  DIABETES MELLITUS, TYPE II, CONTROLLED, MILD (ICD-250.00)  Labs Reviewed: Creat: 1.0  (07/20/2009)    Reviewed HgBA1c results: 5.5 (10/03/2007)  5.1 (04/27/2007)  His updated medication list for this problem includes:    Metformin Hcl 500 Mg Tabs (Metformin hcl) ..... One by mouth two times a day  Complete Medication List: 1)  Prilosec Otc 20 Mg Tbec (Omeprazole magnesium) .... Once daily 2)  Accu-chek Aviva Strp (Glucose blood) .... Test daily as directed 3)  Metformin Hcl 500 Mg Tabs (Metformin hcl) .... One by mouth two times a day  Other Orders: Tdap => 81yrs IM (34742) Admin 1st Vaccine (59563)  Patient Instructions: 1)  Please schedule a follow-up appointment in 2 months. 2)  HbgA1C prior to visit, ICD-9:  250.00 3)  DASH diet 4)  [email or web address] Prescriptions: METFORMIN HCL 500 MG TABS (METFORMIN HCL) one by mouth two times a day  #60 x 11   Entered and Authorized by:   Stacie Glaze MD   Signed by:  Stacie Glaze MD on 07/27/2009   Method used:   Print then Give to Patient   RxID:   [id number] ACCU-CHEK AVIVA  STRP (GLUCOSE BLOOD) test daily as directed  #100 x 6   Entered and Authorized by:   Stacie Glaze MD   Signed by:   Stacie Glaze MD on 07/27/2009   Method used:   Print then Give to Patient   RxID:   [id number]    Immunizations Administered:  Tetanus Vaccine:    Vaccine Type: Tdap    Site: left deltoid    Mfr: GlaxoSmithKline    Dose: 0.5 ml    Route: IM    Given by: Willy Eddy, LPN    Exp. Date: 03/04/2010    Lot #: [id number]

## 2010-06-10 NOTE — Assessment & Plan Note (Signed)
Summary: 2 MONTH FUP//CCM   Vital Signs:  Patient profile:   40 year old male Height:      74 inches Weight:      314 pounds BMI:     40.46 Temp:     98.2 degrees F oral Pulse rate:   76 / minute Resp:     14 per minute BP sitting:   140 / 90  (left arm) Cuff size:   large  Vitals Entered By: Willy Eddy, LPN (Sep 21, 2009 9:51 AM)  Nutrition Counseling: Patient's BMI is greater than 25 and therefore counseled on weight management options. CC: f/u on new med metformin0 pt took for 2 days and started having nausea and vomiting , so he stopped for 3-4 days, until sx subsided, He then started back and nause and vomiting started again so he stopped completely., Type 2 diabetes mellitus follow-up Is Patient Diabetic? Yes Did you bring your meter with you today? No CBG Result 175   Primary Care Provider:  Stacie Glaze MD  CC:  f/u on new med metformin0 pt took for 2 days and started having nausea and vomiting , so he stopped for 3-4 days, until sx subsided, He then started back and nause and vomiting started again so he stopped completely., and Type 2 diabetes mellitus follow-up.  History of Present Illness: weight stable metformin on two sperate trail caused significnact GI side effect craping pain in the abdomin and increased thirst  Type 2 Diabetes Mellitus Follow-Up      This is a 40 year old man who presents for Type 2 diabetes mellitus follow-up.  the pt has measure CBGs in the 160 rnage consistantly.  The patient denies polyuria, polydipsia, blurred vision, self managed hypoglycemia, hypoglycemia requiring help, weight loss, weight gain, and numbness of extremities.  Since the last visit the patient reports good dietary compliance.  The patient has been measuring capillary blood glucose before breakfast.  Since the last visit, the patient reports having had eye care by an ophthalmologist.     Preventive Screening-Counseling & Management  Alcohol-Tobacco     Smoking  Status: never  Problems Prior to Update: 1)  Diabetes Mellitus, Type II, Controlled, Mild  (ICD-250.00) 2)  Preventive Health Care  (ICD-V70.0) 3)  Sebaceous Cyst, Infected  (ICD-706.2) 4)  Obstructive Sleep Apnea  (ICD-327.23) 5)  Family History of Cad Male 1st Degree Relative <50  (ICD-V17.3) 6)  Esophagitis Nec  (ICD-530.19)  Medications Prior to Update: 1)  Prilosec Otc 20 Mg  Tbec (Omeprazole Magnesium) .... Once Daily 2)  Accu-Chek Aviva  Strp (Glucose Blood) .... Test Daily As Directed 3)  Metformin Hcl 500 Mg Tabs (Metformin Hcl) .... One By Mouth Two Times A Day  Current Medications (verified): 1)  Prilosec Otc 20 Mg  Tbec (Omeprazole Magnesium) .... Once Daily 2)  Accu-Chek Aviva  Strp (Glucose Blood) .... Test Daily As Directed 3)  Onglyza 5 Mg Tabs (Saxagliptin Hcl) .... One By Mouth Daily 4)  Neomycin-Polymyxin-Hc 3.5-10000-1 Soln (Neomycin-Polymyxin-Hc) .... 5 Drops in Right Ear 4 Times A Day For 7 Days  Allergies (verified): 1)  ! Metformin Hcl (Metformin Hcl)  Past History:  Family History: Last updated: 01/11/2007 Family History of CAD Male 1st degree relative <50  Social History: Last updated: 01/11/2007 Divorced Never Smoked  Risk Factors: Exercise: yes (07/27/2009)  Risk Factors: Smoking Status: never (09/21/2009)  Past medical, surgical, family and social histories (including risk factors) reviewed, and no changes noted (except as  noted below).  Past Medical History: Reviewed history from 12/19/2006 and no changes required. erosive esophgus Diabetes mellitus, type II chronic chest pain  Past Surgical History: Reviewed history from 12/19/2006 and no changes required. Cholecystectomy  11/07  Family History: Reviewed history from 01/11/2007 and no changes required. Family History of CAD Male 1st degree relative <50  Social History: Reviewed history from 01/11/2007 and no changes required. Divorced Never Smoked  Review of Systems  The  patient denies anorexia, fever, weight loss, weight gain, vision loss, decreased hearing, hoarseness, chest pain, syncope, dyspnea on exertion, peripheral edema, prolonged cough, headaches, hemoptysis, abdominal pain, melena, hematochezia, severe indigestion/heartburn, hematuria, incontinence, genital sores, muscle weakness, suspicious skin lesions, transient blindness, difficulty walking, depression, unusual weight change, abnormal bleeding, enlarged lymph nodes, angioedema, breast masses, and testicular masses.    Physical Exam  General:  alert and overweight-appearing.   Head:  normocephalic and atraumatic.   Eyes:  pupils equal and pupils round.   Ears:  R cerumen impaction and R TM erythema.   Nose:  no external deformity and no nasal discharge.   Neck:  supple.   Lungs:  Normal respiratory effort, chest expands symmetrically. Lungs are clear to auscultation, no crackles or wheezes. Heart:  Normal rate and regular rhythm. S1 and S2 normal without gallop, murmur, click, rub or other extra sounds. Abdomen:  soft and non-tender.     Impression & Recommendations:  Problem # 1:  DIABETES MELLITUS, TYPE II, CONTROLLED, MILD (ICD-250.00) Assessment Deteriorated  The following medications were removed from the medication list:    Metformin Hcl 500 Mg Tabs (Metformin hcl) ..... One by mouth two times a day His updated medication list for this problem includes:    Onglyza 5 Mg Tabs (Saxagliptin hcl) ..... One by mouth daily  Labs Reviewed: Creat: 1.0 (07/20/2009)    Reviewed HgBA1c results: 5.5 (10/03/2007)  5.1 (04/27/2007)  Orders: Capillary Blood Glucose/CBG (16109)  Problem # 2:  ACUTE SWIMMERS EAR (ICD-380.12) Assessment: New discussed therapy His updated medication list for this problem includes:    Neomycin-polymyxin-hc 3.5-10000-1 Soln (Neomycin-polymyxin-hc) .Marland KitchenMarland KitchenMarland KitchenMarland Kitchen 5 drops in right ear 4 times a day for 7 days  Discussed symptomatic treatment and preventive measures.    Complete Medication List: 1)  Prilosec Otc 20 Mg Tbec (Omeprazole magnesium) .... Once daily 2)  Accu-chek Aviva Strp (Glucose blood) .... Test daily as directed 3)  Onglyza 5 Mg Tabs (Saxagliptin hcl) .... One by mouth daily 4)  Neomycin-polymyxin-hc 3.5-10000-1 Soln (Neomycin-polymyxin-hc) .... 5 drops in right ear 4 times a day for 7 days  Patient Instructions: 1)  HbgA1C prior to visit, ICD-9:250.00 2)  seven week ROV Prescriptions: NEOMYCIN-POLYMYXIN-HC 3.5-10000-1 SOLN (NEOMYCIN-POLYMYXIN-HC) 5 drops in right ear 4 times a day for 7 days  #10 cc x 0   Entered and Authorized by:   Stacie Glaze MD   Signed by:   Stacie Glaze MD on 09/21/2009   Method used:   Electronically to        [facility]  [address] [phone]* (retail)       [address]       [place], [place]  [address]       Ph: [phone]       Fax: [phone]   RxID:   [id number]

## 2010-07-06 ENCOUNTER — Ambulatory Visit (HOSPITAL_BASED_OUTPATIENT_CLINIC_OR_DEPARTMENT_OTHER): Payer: Self-pay

## 2010-07-07 ENCOUNTER — Other Ambulatory Visit: Payer: Self-pay

## 2010-07-08 ENCOUNTER — Encounter (INDEPENDENT_AMBULATORY_CARE_PROVIDER_SITE_OTHER): Payer: Self-pay | Admitting: *Deleted

## 2010-07-08 ENCOUNTER — Other Ambulatory Visit: Payer: Self-pay | Admitting: Internal Medicine

## 2010-07-08 ENCOUNTER — Other Ambulatory Visit: Payer: Commercial Managed Care - PPO

## 2010-07-08 DIAGNOSIS — E1169 Type 2 diabetes mellitus with other specified complication: Secondary | ICD-10-CM

## 2010-07-08 DIAGNOSIS — E1165 Type 2 diabetes mellitus with hyperglycemia: Secondary | ICD-10-CM

## 2010-07-13 ENCOUNTER — Encounter: Payer: Self-pay | Admitting: Internal Medicine

## 2010-07-14 ENCOUNTER — Ambulatory Visit (INDEPENDENT_AMBULATORY_CARE_PROVIDER_SITE_OTHER): Payer: Commercial Managed Care - PPO | Admitting: Internal Medicine

## 2010-07-14 ENCOUNTER — Encounter: Payer: Self-pay | Admitting: Internal Medicine

## 2010-07-14 DIAGNOSIS — G4733 Obstructive sleep apnea (adult) (pediatric): Secondary | ICD-10-CM

## 2010-07-14 DIAGNOSIS — E119 Type 2 diabetes mellitus without complications: Secondary | ICD-10-CM

## 2010-07-14 DIAGNOSIS — Z0000 Encounter for general adult medical examination without abnormal findings: Secondary | ICD-10-CM

## 2010-07-14 NOTE — Assessment & Plan Note (Signed)
Has a test for the sleep apnea in april

## 2010-07-14 NOTE — Progress Notes (Signed)
Subjective:    Patient ID: Wayne Ho, male    DOB: Dec 22, 1970, 40 y.o.   MRN: [id number]  HPI  patient is a reviewing year-old white male with adult-onset diabetes moderate obesity obstructive sleep apnea new presents for a followup with a recent bump in his A1c that he attributes to caffeine use he states that when he drinks a diet [facility] that he notes his blood sugars will rise when he drinks waters blood sugars do not rise and sometimes when he has fruit juice his blood sugars do not seem to rise as much as a caffeine-containing fluids he has not used caffeine a regular basis hand there may be a connection between the use of caffeinated beverages elevations his blood glucose he has over the past few weeks been following her diet more rigidly and has lost weight but the A1c is still higher than his last reading blood pressure is stable And he has no other symptoms of chest pain shortness of breath PND orthopnea. He denies any  hypoglycemia   Review of Systems  Constitutional: Negative for fever and fatigue.  HENT: Negative for hearing loss, congestion, neck pain and postnasal drip.   Eyes: Negative for discharge, redness and visual disturbance.  Respiratory: Negative for cough, shortness of breath and wheezing.   Cardiovascular: Negative for leg swelling.  Gastrointestinal: Negative for abdominal pain, constipation and abdominal distention.  Genitourinary: Negative for urgency and frequency.  Musculoskeletal: Negative for joint swelling and arthralgias.  Skin: Negative for color change and rash.  Neurological: Negative for weakness and light-headedness.  Hematological: Negative for adenopathy.  Psychiatric/Behavioral: Negative for behavioral problems.   Past Medical History  Diagnosis Date  . DIABETES MELLITUS, TYPE II, CONTROLLED, MILD 07/27/2009  . ESOPHAGITIS NEC 12/19/2006  . OBSTRUCTIVE SLEEP APNEA 11/02/2007  . Chronic chest pain    Past Surgical History  Procedure Date    . Cholecystectomy 11/07  . Anterior cruciate ligament repair     both knees    reports that he has never smoked. He does not have any smokeless tobacco history on file. He reports that he does not drink alcohol or use illicit drugs. family history includes Coronary artery disease in an unspecified family member; Early death in his father; Heart disease in his father; and Hypertension in his mother.        Objective:   Physical Exam  Constitutional: He appears well-developed and well-nourished.  HENT:  Head: Normocephalic and atraumatic.  Eyes: Conjunctivae are normal. Pupils are equal, round, and reactive to light.  Neck: Normal range of motion. Neck supple.  Cardiovascular: Normal rate and regular rhythm.   Pulmonary/Chest: Effort normal and breath sounds normal.  Abdominal: Soft. Bowel sounds are normal.       Diabetic foot exam:  Left: Reflexes 3+   Vibratory sensation normal  Proprioception normal  Sharp/dull discrimination normal  Filament test present Right: Reflexes 3+   Vibratory sensation normal  Proprioception normal  Sharp/dull discrimination normal  Filament test present    Assessment & Plan:   I am concerned that there is a disconnect between his hemoglobin A1c and his reported blood glucose readings as well as his feeling about how his diabetic control has been he was expecting an excellent report today based upon his monitoring but the A1c actually rose on multiple point we have reviewed his diet caffeine use exercise and weight and we feel that following his CBGs with what he is eating house exercising they gave him  clues as to why the A1c rose this visit he is scheduled for complete physical examination in 3 months at which time we'll repeat the A1c we've encouraged him to avoid caffeinated beverages

## 2010-07-14 NOTE — Assessment & Plan Note (Addendum)
The  a1c was 8.7 but he feels that his cbgs are controlled so there is a disconnect. No sense of low glucoses [place] needed and diet reviewed. Weight is down so we will moniter... The pts related this to caffene and had changed to water

## 2010-09-24 NOTE — Op Note (Signed)
[facility]  Patient:    Wayne Ho, Wayne Ho Visit Number: [id number] MRN: [id number]          Service Type: DSU Location: DAY Attending Physician:  Vikki Ports Proc. Date: 03/15/01 Admit Date:  03/15/2001                             Operative Report  PREOPERATIVE DIAGNOSES:  Cholelithiasis and biliary dyskinesia.  POSTOPERATIVE DIAGNOSES:  Cholelithiasis and biliary dyskinesia.  PROCEDURE:  Laparoscopic cholecystectomy.  ANESTHESIA:  General.  SURGEON:  Catalina Lunger, M.D.  ASSISTANTRiley Lam A. Magnus Ivan, M.D.  DESCRIPTION OF PROCEDURE:  The patient was taken to the operating room and placed in the supine position.  After adequate anesthesia was induced, the abdomen was prepped and draped in the normal sterile fashion.  Using a transverse infraumbilical incision, I dissected down to the fascia.  The fascia was opened vertically.  An 0 Vicryl pursestring suture was placed around the fascial defect and Hasson trocar was placed in the abdomen. Abdomen was insufflated 50 mmHg with continuous-flow carbon dioxide.  Under direct visualization, a 10 mm port was placed in the subxiphoid region.  Two 5 mm ports were placed in the right abdomen.  The gallbladder was identified and retracted cephalad.  It was mildly edematous.  Infundibulum was identified and dissection began there.  A very small cystic duct was isolated, dissected free of surrounding structures.  It was clipped at the gallbladder and small ductotomy was made.  After multiple attempts, the Reddick catheter and Cook catheters could not be passed through the very small lumen of the cystic duct. Second ductotomy was made a little farther distal and again, the catheter could not pass.  I had good length and dissection on the cystic duct, and it was clipped distal to the second ductotomy but proximal to the common duct. The cystic duct was divided.  The cystic artery was then  dissected free of surrounding structures, triply clipped and divided.  The gallbladder was taken off the gallbladder bed using Bovie electrocautery and removed through the umbilical port.  This required a slight increase in the cephalad extent of the incision in the fascia, and a second 0 Vicryl pursestring suture was used to close that defect.  On closing the abdomen, one of the right abdominal trocar sites had some venous bleeding.  This was coagulated, and then a large figure-of-eight 2-0 nylon suture was placed through the skin, through the peritoneum, and brought back out through the skin in a figure-of-eight fashion. This seemed to control hemostasis well.  The abdomen was then allowed to completely deflate.  All trocars were removed.  The skin incisions were closed with subcuticular 4-0 Monocryl.  Steri-Strips and sterile dressings were applied.  The patient tolerated the procedure well and went to PACU in good condition. Attending Physician:  Danna Hefty R. DD:  03/15/01 TD:  03/16/01 Job: [id number]

## 2010-10-08 ENCOUNTER — Other Ambulatory Visit (INDEPENDENT_AMBULATORY_CARE_PROVIDER_SITE_OTHER): Payer: 59

## 2010-10-08 DIAGNOSIS — Z0000 Encounter for general adult medical examination without abnormal findings: Secondary | ICD-10-CM

## 2010-10-08 LAB — CBC WITH DIFFERENTIAL/PLATELET
Basophils Relative: 0.5 % (ref 0.0–3.0)
Eosinophils Relative: 1.9 % (ref 0.0–5.0)
HCT: 44.9 % (ref 39.0–52.0)
Lymphs Abs: 1.8 10*3/uL (ref 0.7–4.0)
MCV: 90.2 fl (ref 78.0–100.0)
Monocytes Absolute: 0.6 10*3/uL (ref 0.1–1.0)
Monocytes Relative: 8.9 % (ref 3.0–12.0)
Neutrophils Relative %: 60.4 % (ref 43.0–77.0)
Platelets: 174 10*3/uL (ref 150.0–400.0)
RBC: 4.98 Mil/uL (ref 4.22–5.81)
WBC: 6.3 10*3/uL (ref 4.5–10.5)

## 2010-10-08 LAB — HEMOGLOBIN A1C: Hgb A1c MFr Bld: 10.7 % — ABNORMAL HIGH (ref 4.6–6.5)

## 2010-10-08 LAB — HEPATIC FUNCTION PANEL
ALT: 122 U/L — ABNORMAL HIGH (ref 0–53)
AST: 67 U/L — ABNORMAL HIGH (ref 0–37)
Albumin: 3.9 g/dL (ref 3.5–5.2)
Alkaline Phosphatase: 90 U/L (ref 39–117)

## 2010-10-08 LAB — POCT URINALYSIS DIPSTICK
Bilirubin, UA: NEGATIVE
Leukocytes, UA: NEGATIVE
Nitrite, UA: NEGATIVE

## 2010-10-08 LAB — BASIC METABOLIC PANEL
BUN: 12 mg/dL (ref 6–23)
CO2: 27 mEq/L (ref 19–32)
Chloride: 98 mEq/L (ref 96–112)
Glucose, Bld: 346 mg/dL — ABNORMAL HIGH (ref 70–99)
Potassium: 4.2 mEq/L (ref 3.5–5.1)
Sodium: 134 mEq/L — ABNORMAL LOW (ref 135–145)

## 2010-10-08 LAB — LIPID PANEL
Cholesterol: 146 mg/dL (ref 0–200)
VLDL: 37.6 mg/dL (ref 0.0–40.0)

## 2010-10-08 LAB — MICROALBUMIN / CREATININE URINE RATIO
Microalb Creat Ratio: 3.4 mg/g (ref 0.0–30.0)
Microalb, Ur: 3.3 mg/dL — ABNORMAL HIGH (ref 0.0–1.9)

## 2010-10-08 LAB — TSH: TSH: 2.68 u[IU]/mL (ref 0.35–5.50)

## 2010-10-15 ENCOUNTER — Encounter: Payer: Self-pay | Admitting: Internal Medicine

## 2010-10-15 ENCOUNTER — Ambulatory Visit (INDEPENDENT_AMBULATORY_CARE_PROVIDER_SITE_OTHER): Payer: 59 | Admitting: Internal Medicine

## 2010-10-15 VITALS — BP 124/84 | HR 84 | Temp 98.2°F | Resp 16 | Ht 75.0 in | Wt 298.0 lb

## 2010-10-15 DIAGNOSIS — E119 Type 2 diabetes mellitus without complications: Secondary | ICD-10-CM

## 2010-10-15 DIAGNOSIS — Z0000 Encounter for general adult medical examination without abnormal findings: Secondary | ICD-10-CM

## 2010-10-15 DIAGNOSIS — R635 Abnormal weight gain: Secondary | ICD-10-CM

## 2010-10-15 MED ORDER — INSULIN GLARGINE 100 UNIT/ML ~~LOC~~ SOLN: SUBCUTANEOUS | Status: DC

## 2010-10-15 NOTE — Patient Instructions (Signed)
Lantus is a long-acting basilar insulin It 2000 Calorie Diabetic Diet The 2000 calorie diabetic diet is designed for eating up to 2000 calories each day. Following this diet and making healthy meal choices can help improve overall health. It controls blood sugar levels. It can also lower blood pressure and cholesterol. SERVING SIZES Measuring foods and serving sizes helps to make sure you are getting the right amount of food. The list below tells how big or small some common serving sizes are.  1 ounce (oz).........................4 stacked dice.   3 oz.....................................Marland KitchenDeck of cards.   1 teaspoon (tsp)..................Marland KitchenTip of little finger.   1 tablespoon (Tbsp)...........Marland KitchenMarland KitchenThumb.   2 Tbsp.................................Marland KitchenGolf ball.   1/2 cup................................Marland KitchenHalf of a fist.   1 cup...................................Marland KitchenA fist.  GUIDELINES FOR CHOOSING FOODS The goal of this diet is to eat a variety of foods and limit calories to 2000 each day. This can be done by choosing foods that are low in calories and fat. The diet also suggests eating small amounts of food often. Doing this helps control your blood sugar levels so they do not get too high or too low. Each meal or snack should contain a protein food source to help you feel more satisfied and to stabilize your blood sugar. Try to eat about the same amount of food around the same time each day. This includes weekend days, travel days, and days off work. Space your meals about 4 to 5 hours apart and add a snack between them if you wish. For example, a daily food plan could include breakfast, a morning snack, lunch, dinner, and an evening snack. Healthy meals and snacks include whole grains, vegetables, fruits, lean meats, poultry, fish, and dairy products. As you plan your meals, choose a variety of foods. Choose from the bread and starches, vegetables, fruit, dairy, and meat/protein groups. Examples of foods  from each group are listed below with their suggested serving sizes. Use measuring cups and spoons to become familiar with what a healthy portion looks like. Bread and Starches Each serving equals 15 grams of carbohydrates 1 slice bread 1/4 bagel 3/4 cup or 1 cup cold cereal (unsweetened) 1/2 cup hot cereal or mashed potatoes 1 small potato (size of a computer mouse) 1/3 cup cooked pasta or rice 1/2 English muffin 1 cup broth-based soup 3 cups popcorn 4 to 6 whole-wheat crackers 1/2 cup cooked beans, peas, or corn Vegetables Each serving equals 5 grams of carbohydrates 1/2 cup cooked vegetables 1 cup raw vegetables 1/2 cup tomato juice Fruit Each serving equals 15 grams of carbohydrates 1 small apple, banana, or orange 1 1/4 cup watermelon or strawberries 1/2 cup applesauce (no sugar added) 2 Tbsp raisins 1/2 banana 1/2 cup unsweetened canned fruit 1/2 cup unsweetened fruit juice Dairy Each serving equals 12 to 15 grams of carbohydrates 1 cup fat-free milk 6 oz artificially sweetened yogurt 1 cup buttermilk 1 cup soy milk Meat/Protein 1 large egg 2 to 3 oz meat, poultry, or fish 1/2 cup cottage cheese 1 Tbsp peanut butter 1/2 cup tofu 1 oz cheese 1/4 cup tuna canned in water SAMPLE 2000 CALORIE DIET PLAN Breakfast  1 English muffin (2 carb choices)   Reduced fat cream cheese, 1 Tbsp   1 scrambled egg   1/2 grapefruit (1 carb choice)   Fat-free milk, 1 cup (1 carb choice)  Morning Snack  Artificially sweetened yogurt, 6 oz (1 carb choice)   2 Tbsp chopped nuts   1 small peach (1 carb choice)  Lunch  Grilled  chicken sandwich:   1 hamburger bun (2 carb choices)   2 oz chicken breast   1 lettuce leaf   2 slices tomato   Reduced fat mayonnaise, 1 Tbsp   Carrot sticks, 1 cup   Celery, 1 cup   1 small apple (1 carb choice)   Fat-free milk, 1 cup (1 carb choice)  Afternoon Snack  1/2 cup low-fat cottage cheese   1 1/4 cups strawberries (1  carb choice)  Dinner  Steak fajitas:   2 oz lean steak   1 whole-wheat tortilla, 8 inches (1.5 carb choices)   Shredded lettuce, 1/4 cup   2 slices tomato   Salsa, 1/4 cup   Low-fat sour cream, 2 Tbsp   Brown rice, 1/3 cup (1 carb choice)   1 small orange (1 carb choice)  Evening Snack  4 reduced fat whole-wheat crackers (1 carb choice)   1 Tbsp peanut butter   12 to 15 grapes (1 carb choice)  MEAL PLAN Use this worksheet to help you make a daily meal plan based on the 2000 calorie diabetic diet suggestions. The total amount of carbohydrates in your meal or snack is more important than making sure you include all of the food groups at every meal or snack. If you are using this plan to help you control your blood glucose, you may interchange carbohydrate containing foods (dairy, starches, and fruits). Choose a variety of fresh foods of varying colors and flavors. You can choose from the following foods to build your day's meals:  11 Starches.   4 Vegetables.   3 Fruits.   3 Dairy.   8 oz Meat.   Up to 6 Fats.  Your dietician can use this worksheet to help you decide how many servings and what types of foods are right for you. Breakfast Food Group and Servings Food Choice Starches ____________________________________________ Dairy ______________________________________________ Fruit _______________________________________________ Meat _______________________________________________ Fat_________________________________________________ Wayne Ho Food Group and Servings Food Choice Starch _____________________________________________ Meat ______________________________________________ Vegetables __________________________________________ Fruit _______________________________________________ Dairy_______________________________________________ Fat_________________________________________________ Afternoon Snack Food Group and Servings Food  Choice Starch_________________________________________________ Meat__________________________________________________ Fruit__________________________________________________ Wayne Ho Group and Servings Food Choice Starches ____________________________________________ Meat _______________________________________________ Dairy _______________________________________________ Vegetables ___________________________________________ Fruit ________________________________________________ Fat__________________________________________________ Evening Snack Food Group and Servings Food Choice Fruit _______________________________________________ Meat _______________________________________________ Starch ______________________________________________ Daily Totals Starches _________________________ Vegetables _______________________ Fruit ___________________________ Dairy ___________________________ Meat ____________________________ Fat _____________________________ Document Released: 11/15/2004 Document Re-Released: 10/13/2009 [employer] Patient Information 2011 [employer]works 24 hours to lower the sugars without causing hypoglycemia

## 2010-11-19 ENCOUNTER — Other Ambulatory Visit: Payer: 59

## 2010-11-22 ENCOUNTER — Other Ambulatory Visit (INDEPENDENT_AMBULATORY_CARE_PROVIDER_SITE_OTHER): Payer: 59

## 2010-11-22 DIAGNOSIS — E119 Type 2 diabetes mellitus without complications: Secondary | ICD-10-CM

## 2010-11-22 DIAGNOSIS — Z0000 Encounter for general adult medical examination without abnormal findings: Secondary | ICD-10-CM

## 2010-11-22 LAB — CBC WITH DIFFERENTIAL/PLATELET
Basophils Relative: 0.7 % (ref 0.0–3.0)
Eosinophils Absolute: 0.1 10*3/uL (ref 0.0–0.7)
Lymphocytes Relative: 29.3 % (ref 12.0–46.0)
MCHC: 34.7 g/dL (ref 30.0–36.0)
Neutrophils Relative %: 58.7 % (ref 43.0–77.0)
RBC: 5 Mil/uL (ref 4.22–5.81)
WBC: 6.2 10*3/uL (ref 4.5–10.5)

## 2010-11-22 LAB — HEPATIC FUNCTION PANEL
ALT: 105 U/L — ABNORMAL HIGH (ref 0–53)
AST: 47 U/L — ABNORMAL HIGH (ref 0–37)
Alkaline Phosphatase: 75 U/L (ref 39–117)
Bilirubin, Direct: 0.2 mg/dL (ref 0.0–0.3)
Total Bilirubin: 1.6 mg/dL — ABNORMAL HIGH (ref 0.3–1.2)

## 2010-11-22 LAB — LIPID PANEL
LDL Cholesterol: 89 mg/dL (ref 0–99)
Total CHOL/HDL Ratio: 4
Triglycerides: 108 mg/dL (ref 0.0–149.0)

## 2010-11-22 LAB — TSH: TSH: 2.75 u[IU]/mL (ref 0.35–5.50)

## 2010-11-22 LAB — BASIC METABOLIC PANEL
Chloride: 99 mEq/L (ref 96–112)
Potassium: 4.2 mEq/L (ref 3.5–5.1)
Sodium: 133 mEq/L — ABNORMAL LOW (ref 135–145)

## 2010-11-26 ENCOUNTER — Ambulatory Visit: Payer: 59 | Admitting: Internal Medicine

## 2010-12-06 ENCOUNTER — Ambulatory Visit: Payer: 59 | Admitting: Internal Medicine

## 2011-02-07 ENCOUNTER — Ambulatory Visit: Payer: 59 | Admitting: Internal Medicine

## 2011-03-11 ENCOUNTER — Ambulatory Visit (INDEPENDENT_AMBULATORY_CARE_PROVIDER_SITE_OTHER): Payer: 59 | Admitting: Internal Medicine

## 2011-03-11 VITALS — BP 142/90 | HR 76 | Temp 98.2°F | Resp 16 | Ht 75.0 in | Wt 301.0 lb

## 2011-03-11 DIAGNOSIS — E119 Type 2 diabetes mellitus without complications: Secondary | ICD-10-CM

## 2011-03-11 MED ORDER — "INSULIN SYRINGE-NEEDLE U-100 31G X 5/16"" 1 ML MISC": 1.0000 [IU] | Freq: Every day | Status: DC

## 2011-03-11 MED ORDER — INSULIN GLARGINE 100 UNIT/ML ~~LOC~~ SOLN: 20.0000 [IU] | Freq: Every day | SUBCUTANEOUS | Status: DC

## 2011-03-11 NOTE — Patient Instructions (Signed)
The patient is instructed to continue all medications as prescribed. Schedule followup with check out clerk upon leaving the clinic  

## 2011-05-14 ENCOUNTER — Other Ambulatory Visit: Payer: Self-pay | Admitting: Internal Medicine

## 2011-06-15 NOTE — Progress Notes (Signed)
  Subjective:    Patient ID: Wayne Ho, male    DOB: February 23, 1971, 41 y.o.   MRN: [id number]  HPI    Review of Systems     Objective:   Physical Exam        Assessment & Plan:

## 2011-06-21 ENCOUNTER — Other Ambulatory Visit: Payer: 59

## 2011-07-04 ENCOUNTER — Telehealth: Payer: Self-pay | Admitting: Internal Medicine

## 2011-07-04 NOTE — Telephone Encounter (Signed)
Pt cancelled ov for tomorrow. Pt stated he need labs before ov. What labs can I sch for pt?

## 2011-07-04 NOTE — Telephone Encounter (Signed)
Per dr Lovell Sheehan- all he needs is an hgb aic ( he could have had that while at appointment and we could have called him with results

## 2011-07-05 ENCOUNTER — Ambulatory Visit: Payer: 59 | Admitting: Internal Medicine

## 2011-07-06 NOTE — Telephone Encounter (Signed)
lmom for pt to call back

## 2011-07-08 NOTE — Telephone Encounter (Signed)
Pt is sch for labs 07-18-11 8am and follow up with dr Lovell Sheehan 07-25-2011

## 2011-07-18 ENCOUNTER — Other Ambulatory Visit (INDEPENDENT_AMBULATORY_CARE_PROVIDER_SITE_OTHER): Payer: 59

## 2011-07-18 DIAGNOSIS — IMO0001 Reserved for inherently not codable concepts without codable children: Secondary | ICD-10-CM

## 2011-07-25 ENCOUNTER — Encounter: Payer: Self-pay | Admitting: Internal Medicine

## 2011-07-25 ENCOUNTER — Ambulatory Visit (INDEPENDENT_AMBULATORY_CARE_PROVIDER_SITE_OTHER): Payer: 59 | Admitting: Internal Medicine

## 2011-07-25 VITALS — BP 140/84 | HR 80 | Temp 98.3°F | Resp 16 | Ht 75.0 in | Wt 296.0 lb

## 2011-07-25 DIAGNOSIS — E1169 Type 2 diabetes mellitus with other specified complication: Secondary | ICD-10-CM

## 2011-07-25 DIAGNOSIS — E1165 Type 2 diabetes mellitus with hyperglycemia: Secondary | ICD-10-CM

## 2011-07-25 NOTE — Patient Instructions (Signed)
The patient is instructed to continue all medications as prescribed. Schedule followup with check out clerk upon leaving the clinic  

## 2011-07-25 NOTE — Progress Notes (Signed)
  Subjective:    Patient ID: Wayne Ho, male    DOB: January 06, 1971, 41 y.o.   MRN: [id number]  HPI This is a 41 year old adult onset diabetic who presents for followup.  He has lost weight his A1c is significantly improved since last A1c which showed a loss of control he states his CBGs have been in the 120 or less range.   Review of Systems  Constitutional: Negative for fever and fatigue.  HENT: Negative for hearing loss, congestion, neck pain and postnasal drip.   Eyes: Negative for discharge, redness and visual disturbance.  Respiratory: Negative for cough, shortness of breath and wheezing.   Cardiovascular: Negative for leg swelling.  Gastrointestinal: Negative for abdominal pain, constipation and abdominal distention.  Genitourinary: Negative for urgency and frequency.  Musculoskeletal: Negative for joint swelling and arthralgias.  Skin: Negative for color change and rash.  Neurological: Negative for weakness and light-headedness.  Hematological: Negative for adenopathy.  Psychiatric/Behavioral: Negative for behavioral problems.       Objective:   Physical Exam  Nursing note and vitals reviewed. Constitutional: He appears well-developed and well-nourished.  HENT:  Head: Normocephalic and atraumatic.  Eyes: Conjunctivae are normal. Pupils are equal, round, and reactive to light.  Neck: Normal range of motion. Neck supple.  Cardiovascular: Normal rate and regular rhythm.   Pulmonary/Chest: Effort normal and breath sounds normal.  Abdominal: Soft. Bowel sounds are normal.          Assessment & Plan:  Her plan is to continue the oral agents in the 20 units of Lantus at this time. Since he has had some readings as low as 70 we will not increase the Lantus but continue to work on weight control he was able to lose weight over the winter at which time he normally gained weight we helped her in the summer months he'll lose some additional weight as a reasonable goal of between  5 and 10 pounds before next  visit

## 2011-08-09 ENCOUNTER — Encounter: Payer: Self-pay | Admitting: Internal Medicine

## 2011-08-09 NOTE — Progress Notes (Signed)
  Subjective:    Patient ID: Wayne Ho, male    DOB: November 04, 1970, 41 y.o.   MRN: [id number]  HPI Patient is a 41 year old obese male with adult-onset diabetes is poorly controlled and he has been noncompliant with his medications displaying a poor understanding of his diagnosis.     Review of Systems  Constitutional: Negative for fever and fatigue.  HENT: Negative for hearing loss, congestion, neck pain and postnasal drip.   Eyes: Negative for discharge, redness and visual disturbance.  Respiratory: Negative for cough, shortness of breath and wheezing.   Cardiovascular: Negative for leg swelling.  Gastrointestinal: Negative for abdominal pain, constipation and abdominal distention.  Genitourinary: Negative for urgency and frequency.  Musculoskeletal: Negative for joint swelling and arthralgias.  Skin: Negative for color change and rash.  Neurological: Negative for weakness and light-headedness.  Hematological: Negative for adenopathy.  Psychiatric/Behavioral: Negative for behavioral problems.       Objective:   Physical Exam  Nursing note and vitals reviewed. Constitutional: He appears well-developed and well-nourished.  HENT:  Head: Normocephalic and atraumatic.  Eyes: Conjunctivae are normal. Pupils are equal, round, and reactive to light.  Neck: Normal range of motion. Neck supple.  Cardiovascular: Normal rate and regular rhythm.   Pulmonary/Chest: Effort normal and breath sounds normal.  Abdominal: Soft. Bowel sounds are normal.          Assessment & Plan:  Poorly controlled adult onset diabetes due to failure of weight management failure of compliance with medication and poor understanding of disease state.  We discussed referral to nutrition we discussed diet and exercise we set goals for diet and exercise as well as compliance with medications he will begin Lantus injections at night and these were taught to him.

## 2012-03-27 ENCOUNTER — Telehealth: Payer: Self-pay | Admitting: Family Medicine

## 2012-03-27 ENCOUNTER — Other Ambulatory Visit: Payer: Self-pay | Admitting: *Deleted

## 2012-03-27 MED ORDER — SITAGLIP PHOS-METFORMIN HCL ER 100-1000 MG PO TB24: 100.0000 mg | ORAL_TABLET | Freq: Every day | ORAL | Status: DC

## 2012-03-27 NOTE — Telephone Encounter (Signed)
Pt called back. Notified him of med change to Janumet XR. He or his wife will be in today or tomorrow to pick up samples and coupon card.

## 2012-04-11 ENCOUNTER — Telehealth: Payer: Self-pay | Admitting: Internal Medicine

## 2012-04-11 NOTE — Telephone Encounter (Addendum)
Pt has follow up appt on for meds concerning blood sugar on 06/11/12.  Do I need to schedule labs before? Pt thought he had labs to schedule before his appt.

## 2012-04-11 NOTE — Telephone Encounter (Signed)
Pt has appt on 06/11/12. He has only 1 Lantus insulin pen left and will be out by [place]. He wants to know if we have any samples avail that he can get until he sees Dr. Lovell Sheehan in Feb. Please advise.

## 2012-04-11 NOTE — Telephone Encounter (Signed)
I called pt and LMOM re samples. Asked him to call back if he wants [place] to call the Lantus pen in.

## 2012-04-11 NOTE — Telephone Encounter (Signed)
Sorry, we dont have any but will be glad to call to pharmacy

## 2012-04-12 NOTE — Telephone Encounter (Signed)
Avon Gully called back. He would like you to send in a rx on the insulin glargine (LANTUS SOLOSTAR) 100 UNIT/ML injection [facility] to [facility] in [place], [place] - [address]. Thanks.

## 2012-04-13 ENCOUNTER — Other Ambulatory Visit: Payer: Self-pay | Admitting: *Deleted

## 2012-04-13 MED ORDER — INSULIN GLARGINE 100 UNIT/ML ~~LOC~~ SOLN: 20.0000 [IU] | Freq: Every day | SUBCUTANEOUS | Status: DC

## 2012-04-13 NOTE — Telephone Encounter (Signed)
Pt not here since march, there fore he needs to see md first and let him decided wqat to order

## 2012-04-26 ENCOUNTER — Other Ambulatory Visit: Payer: Self-pay | Admitting: *Deleted

## 2012-06-07 ENCOUNTER — Other Ambulatory Visit (INDEPENDENT_AMBULATORY_CARE_PROVIDER_SITE_OTHER): Payer: BC Managed Care – PPO

## 2012-06-07 DIAGNOSIS — E1169 Type 2 diabetes mellitus with other specified complication: Secondary | ICD-10-CM

## 2012-06-07 DIAGNOSIS — E1165 Type 2 diabetes mellitus with hyperglycemia: Secondary | ICD-10-CM

## 2012-06-11 ENCOUNTER — Ambulatory Visit (INDEPENDENT_AMBULATORY_CARE_PROVIDER_SITE_OTHER): Payer: BC Managed Care – PPO | Admitting: Internal Medicine

## 2012-06-11 ENCOUNTER — Encounter: Payer: Self-pay | Admitting: Internal Medicine

## 2012-06-11 VITALS — BP 126/90 | HR 72 | Temp 98.0°F | Resp 16 | Wt 295.0 lb

## 2012-06-11 DIAGNOSIS — R635 Abnormal weight gain: Secondary | ICD-10-CM

## 2012-06-11 DIAGNOSIS — E119 Type 2 diabetes mellitus without complications: Secondary | ICD-10-CM

## 2012-06-11 DIAGNOSIS — E1165 Type 2 diabetes mellitus with hyperglycemia: Secondary | ICD-10-CM

## 2012-06-11 DIAGNOSIS — E1169 Type 2 diabetes mellitus with other specified complication: Secondary | ICD-10-CM

## 2012-06-11 MED ORDER — INSULIN GLARGINE 100 UNIT/ML ~~LOC~~ SOLN: 25.0000 [IU] | Freq: Every day | SUBCUTANEOUS | Status: DC

## 2012-06-11 MED ORDER — "INSULIN SYRINGE-NEEDLE U-100 31G X 5/16"" 1 ML MISC": 1.0000 [IU] | Freq: Every day | Status: DC

## 2012-06-11 NOTE — Progress Notes (Signed)
  Subjective:    Patient ID: Wayne Ho, male    DOB: [birth year], 42 y.o.   MRN: [id number]  HPI  Follow up for DM using 20 units of lantus at bedtime and janumet 100/1000  Review of Systems  Constitutional: Negative for fever and fatigue.  HENT: Negative for hearing loss, congestion, neck pain and postnasal drip.   Eyes: Negative for discharge, redness and visual disturbance.  Respiratory: Negative for cough, shortness of breath and wheezing.   Cardiovascular: Negative for leg swelling.  Gastrointestinal: Negative for abdominal pain, constipation and abdominal distention.  Genitourinary: Negative for urgency and frequency.  Musculoskeletal: Negative for joint swelling and arthralgias.  Skin: Negative for color change and rash.  Neurological: Negative for weakness and light-headedness.  Hematological: Negative for adenopathy.  Psychiatric/Behavioral: Negative for behavioral problems.   Past Medical History  Diagnosis Date  . DIABETES MELLITUS, TYPE II, CONTROLLED, MILD 07/27/2009  . ESOPHAGITIS NEC 12/19/2006  . OBSTRUCTIVE SLEEP APNEA 11/02/2007  . Chronic chest pain     History   Social History  . Marital Status: Married    Spouse Name: N/A    Number of Children: N/A  . Years of Education: N/A   Occupational History  . Not on file.   Social History Main Topics  . Smoking status: Never Smoker   . Smokeless tobacco: Not on file  . Alcohol Use: No  . Drug Use: No  . Sexually Active: Yes   Other Topics Concern  . Not on file   Social History Narrative  . No narrative on file    Past Surgical History  Procedure Date  . Cholecystectomy 11/07  . Anterior cruciate ligament repair     both knees    Family History  Problem Relation Age of Onset  . Coronary artery disease    . Hypertension Mother   . Heart disease Father   . Early death Father     No Known Allergies  Current Outpatient Prescriptions on File Prior to Visit  Medication Sig Dispense Refill   . glucose blood (ACCU-CHEK AVIVA) test strip 1 each by Other route as needed. Use as instructed       . omeprazole (PRILOSEC) 20 MG capsule Take 20 mg by mouth daily.        . SitaGLIPtin-MetFORMIN HCl (JANUMET XR) [phone] MG TB24 Take 100 mg by mouth daily.  30 tablet  6  . insulin glargine (LANTUS SOLOSTAR) 100 UNIT/ML injection Inject 20 Units into the skin at bedtime.  12 mL  12    BP 126/90  Pulse 72  Temp 98 F (36.7 C) (Oral)  Resp 16  Wt 295 lb (133.811 kg)  SpO2 98%        Objective:   Physical Exam  Nursing note and vitals reviewed. Constitutional: He appears well-developed and well-nourished.  HENT:  Head: Normocephalic and atraumatic.  Eyes: Conjunctivae normal are normal. Pupils are equal, round, and reactive to light.  Neck: Normal range of motion. Neck supple.  Cardiovascular: Normal rate and regular rhythm.   Pulmonary/Chest: Effort normal and breath sounds normal.  Abdominal: Soft. Bowel sounds are normal.  Skin: Skin is warm and dry.          Assessment & Plan:  Slight increase in the lantus needed for better control Had hyperglycemia with change in medications

## 2012-09-17 ENCOUNTER — Other Ambulatory Visit (INDEPENDENT_AMBULATORY_CARE_PROVIDER_SITE_OTHER): Payer: BC Managed Care – PPO

## 2012-09-17 DIAGNOSIS — Z0000 Encounter for general adult medical examination without abnormal findings: Secondary | ICD-10-CM

## 2012-09-17 LAB — CBC WITH DIFFERENTIAL/PLATELET
Basophils Relative: 0.9 % (ref 0.0–3.0)
Eosinophils Relative: 2.3 % (ref 0.0–5.0)
HCT: 45 % (ref 39.0–52.0)
Hemoglobin: 15.8 g/dL (ref 13.0–17.0)
Lymphs Abs: 1.3 10*3/uL (ref 0.7–4.0)
Monocytes Relative: 15.8 % — ABNORMAL HIGH (ref 3.0–12.0)
Neutro Abs: 3.1 10*3/uL (ref 1.4–7.7)
Platelets: 131 10*3/uL — ABNORMAL LOW (ref 150.0–400.0)
RBC: 5.2 Mil/uL (ref 4.22–5.81)
WBC: 5.4 10*3/uL (ref 4.5–10.5)

## 2012-09-17 LAB — HEPATIC FUNCTION PANEL
AST: 33 U/L (ref 0–37)
Alkaline Phosphatase: 90 U/L (ref 39–117)
Bilirubin, Direct: 0.1 mg/dL (ref 0.0–0.3)
Total Bilirubin: 0.7 mg/dL (ref 0.3–1.2)

## 2012-09-17 LAB — POCT URINALYSIS DIPSTICK
Bilirubin, UA: NEGATIVE
Ketones, UA: NEGATIVE
Leukocytes, UA: NEGATIVE
Spec Grav, UA: 1.025

## 2012-09-17 LAB — LIPID PANEL: Triglycerides: 226 mg/dL — ABNORMAL HIGH (ref 0.0–149.0)

## 2012-09-17 LAB — MICROALBUMIN / CREATININE URINE RATIO: Microalb Creat Ratio: 1.2 mg/g (ref 0.0–30.0)

## 2012-09-17 LAB — TSH: TSH: 1.54 u[IU]/mL (ref 0.35–5.50)

## 2012-09-17 LAB — BASIC METABOLIC PANEL
BUN: 10 mg/dL (ref 6–23)
Calcium: 9.2 mg/dL (ref 8.4–10.5)
GFR: 98.78 mL/min (ref >60.00)
Glucose, Bld: 263 mg/dL — ABNORMAL HIGH (ref 70–99)

## 2012-09-17 LAB — HEMOGLOBIN A1C: Hgb A1c MFr Bld: 9 % — ABNORMAL HIGH (ref 4.6–6.5)

## 2012-09-24 ENCOUNTER — Telehealth: Payer: Self-pay | Admitting: *Deleted

## 2012-09-24 ENCOUNTER — Encounter: Payer: Self-pay | Admitting: Internal Medicine

## 2012-09-24 ENCOUNTER — Ambulatory Visit (INDEPENDENT_AMBULATORY_CARE_PROVIDER_SITE_OTHER): Payer: BC Managed Care – PPO | Admitting: Internal Medicine

## 2012-09-24 VITALS — BP 140/78 | HR 78 | Temp 98.2°F | Resp 18 | Ht 75.0 in | Wt 300.0 lb

## 2012-09-24 DIAGNOSIS — E119 Type 2 diabetes mellitus without complications: Secondary | ICD-10-CM

## 2012-09-24 DIAGNOSIS — E1110 Type 2 diabetes mellitus with ketoacidosis without coma: Secondary | ICD-10-CM

## 2012-09-24 DIAGNOSIS — E1310 Other specified diabetes mellitus with ketoacidosis without coma: Secondary | ICD-10-CM

## 2012-09-24 DIAGNOSIS — Z0000 Encounter for general adult medical examination without abnormal findings: Secondary | ICD-10-CM

## 2012-09-24 MED ORDER — "INSULIN SYRINGE-NEEDLE U-100 31G X 5/16"" 1 ML MISC": 1.0000 [IU] | Freq: Every day | Status: DC

## 2012-09-24 MED ORDER — SAXAGLIPTIN-METFORMIN ER 5-1000 MG PO TB24: 1.0000 | ORAL_TABLET | Freq: Every day | ORAL | Status: DC

## 2012-09-24 NOTE — Progress Notes (Signed)
Subjective:    Patient ID: Wayne Ho, male    DOB: 24-Jul-1970, 42 y.o.   MRN: [id number]  HPI CPX Poor ly controlled DM and weight   Review of Systems  Constitutional: Negative for fever and fatigue.       Obese  HENT: Negative for hearing loss, congestion, neck pain and postnasal drip.   Eyes: Negative for discharge, redness and visual disturbance.  Respiratory: Negative for cough, shortness of breath and wheezing.   Cardiovascular: Negative for leg swelling.  Gastrointestinal: Negative for abdominal pain, constipation and abdominal distention.  Genitourinary: Negative for urgency and frequency.  Musculoskeletal: Negative for joint swelling and arthralgias.  Skin: Negative for color change and rash.  Neurological: Negative for weakness and light-headedness.  Hematological: Negative for adenopathy.  Psychiatric/Behavioral: Negative for behavioral problems.   Past Medical History  Diagnosis Date  . DIABETES MELLITUS, TYPE II, CONTROLLED, MILD 07/27/2009  . ESOPHAGITIS NEC 12/19/2006  . OBSTRUCTIVE SLEEP APNEA 11/02/2007  . Chronic chest pain     History   Social History  . Marital Status: Married    Spouse Name: N/A    Number of Children: N/A  . Years of Education: N/A   Occupational History  . Not on file.   Social History Main Topics  . Smoking status: Never Smoker   . Smokeless tobacco: Not on file  . Alcohol Use: No  . Drug Use: No  . Sexually Active: Yes   Other Topics Concern  . Not on file   Social History Narrative  . No narrative on file    Past Surgical History  Procedure Laterality Date  . Cholecystectomy  11/07  . Anterior cruciate ligament repair      both knees    Family History  Problem Relation Age of Onset  . Coronary artery disease    . Hypertension Mother   . Heart disease Father   . Early death Father     No Known Allergies  Current Outpatient Prescriptions on File Prior to Visit  Medication Sig Dispense Refill  . glucose  blood (ACCU-CHEK AVIVA) test strip 1 each by Other route as needed. Use as instructed       . insulin glargine (LANTUS SOLOSTAR) 100 UNIT/ML injection Inject 25 Units into the skin at bedtime.  6 pen  PRN  . Insulin Syringe-Needle U-100 (B-D INS SYR ULTRAFINE 1CC/31G) 31G X 5/16" 1 ML MISC 1 Units by Does not apply route daily.  100 each  11  . omeprazole (PRILOSEC) 20 MG capsule Take 20 mg by mouth daily.        . SitaGLIPtin-MetFORMIN HCl (JANUMET XR) [phone] MG TB24 Take 100 mg by mouth daily.  30 tablet  6   No current facility-administered medications on file prior to visit.    BP 140/78  Pulse 78  Temp(Src) 98.2 F (36.8 C)  Resp 18  Ht 6\' 3"  (1.905 m)  Wt 300 lb (136.079 kg)  BMI 37.5 kg/m2       Objective:   Physical Exam  Nursing note and vitals reviewed. Constitutional: He is oriented to person, place, and time. He appears well-developed and well-nourished.  HENT:  Head: Normocephalic and atraumatic.  Eyes: Conjunctivae are normal. Pupils are equal, round, and reactive to light.  Neck: Normal range of motion. Neck supple.  Cardiovascular: Normal rate and regular rhythm.   Pulmonary/Chest: Effort normal and breath sounds normal.  Abdominal: Soft. Bowel sounds are normal.  Genitourinary: Rectum normal and prostate normal.  Musculoskeletal: Normal range of motion.  Neurological: He is alert and oriented to person, place, and time.  Skin: Skin is warm and dry.          Assessment & Plan:   Patient presents for yearly preventative medicine examination.   all immunizations and health maintenance protocols were reviewed with the patient and they are up to date with these protocols.   screening laboratory values were reviewed with the patient including screening of hyperlipidemia PSA renal function and hepatic function.   There medications past medical history social history problem list and allergies were reviewed in detail.   Goals were established with regard  to weight loss exercise diet in compliance with medications  Diabetic diet follow up poor compliance A1c today  Elevated to 9.0  Diet is the key

## 2012-09-24 NOTE — Telephone Encounter (Signed)
, °

## 2012-09-24 NOTE — Patient Instructions (Addendum)
Back to the [facility] and if it need to be prior authorized have pharmacy call us Weight loss key

## 2012-12-17 ENCOUNTER — Other Ambulatory Visit (INDEPENDENT_AMBULATORY_CARE_PROVIDER_SITE_OTHER): Payer: BC Managed Care – PPO

## 2012-12-17 DIAGNOSIS — E1110 Type 2 diabetes mellitus with ketoacidosis without coma: Secondary | ICD-10-CM

## 2012-12-17 DIAGNOSIS — E1310 Other specified diabetes mellitus with ketoacidosis without coma: Secondary | ICD-10-CM

## 2012-12-17 LAB — HEMOGLOBIN A1C: Hgb A1c MFr Bld: 9.8 % — ABNORMAL HIGH (ref 4.6–6.5)

## 2012-12-26 ENCOUNTER — Encounter: Payer: Self-pay | Admitting: Internal Medicine

## 2012-12-26 ENCOUNTER — Ambulatory Visit (INDEPENDENT_AMBULATORY_CARE_PROVIDER_SITE_OTHER): Payer: BC Managed Care – PPO | Admitting: Internal Medicine

## 2012-12-26 VITALS — BP 130/80 | HR 76 | Temp 98.0°F | Resp 16 | Ht 75.0 in | Wt 292.0 lb

## 2012-12-26 DIAGNOSIS — IMO0002 Reserved for concepts with insufficient information to code with codable children: Secondary | ICD-10-CM

## 2012-12-26 DIAGNOSIS — E1310 Other specified diabetes mellitus with ketoacidosis without coma: Secondary | ICD-10-CM

## 2012-12-26 DIAGNOSIS — E1165 Type 2 diabetes mellitus with hyperglycemia: Secondary | ICD-10-CM

## 2012-12-26 DIAGNOSIS — E1110 Type 2 diabetes mellitus with ketoacidosis without coma: Secondary | ICD-10-CM

## 2012-12-26 MED ORDER — INSULIN GLARGINE 100 UNIT/ML ~~LOC~~ SOLN: 30.0000 [IU] | Freq: Every day | SUBCUTANEOUS | Status: DC

## 2012-12-26 NOTE — Progress Notes (Signed)
Subjective:    Patient ID: Wayne Ho, male    DOB: 29-Apr-1971, 42 y.o.   MRN: [id number]  HPI  42-year-old male with adult-onset diabetes his hemoglobin A1c has been difficult to control his hemoglobin A1c today is still greater than 9.  We discussed the role his weight is compliant with his medications and the probable need to increase his insulin from 25 units to 30 units with a sliding scale to increase the insulin until we get the CBGs within 90-110 fasting in the morning  Review of Systems  Constitutional: Negative for fever and fatigue.  HENT: Negative for hearing loss, congestion, neck pain and postnasal drip.   Eyes: Negative for discharge, redness and visual disturbance.  Respiratory: Negative for cough, shortness of breath and wheezing.   Cardiovascular: Negative for leg swelling.  Gastrointestinal: Negative for abdominal pain, constipation and abdominal distention.  Genitourinary: Negative for urgency and frequency.  Musculoskeletal: Negative for joint swelling and arthralgias.  Skin: Negative for color change and rash.  Neurological: Negative for weakness and light-headedness.  Hematological: Negative for adenopathy.  Psychiatric/Behavioral: Negative for behavioral problems.   Past Medical History  Diagnosis Date  . DIABETES MELLITUS, TYPE II, CONTROLLED, MILD 07/27/2009  . ESOPHAGITIS NEC 12/19/2006  . OBSTRUCTIVE SLEEP APNEA 11/02/2007  . Chronic chest pain     History   Social History  . Marital Status: Married    Spouse Name: N/A    Number of Children: N/A  . Years of Education: N/A   Occupational History  . Not on file.   Social History Main Topics  . Smoking status: Never Smoker   . Smokeless tobacco: Not on file  . Alcohol Use: No  . Drug Use: No  . Sexual Activity: Yes   Other Topics Concern  . Not on file   Social History Narrative  . No narrative on file    Past Surgical History  Procedure Laterality Date  . Cholecystectomy  11/07  .  Anterior cruciate ligament repair      both knees    Family History  Problem Relation Age of Onset  . Coronary artery disease    . Hypertension Mother   . Heart disease Father   . Early death Father     No Known Allergies  Current Outpatient Prescriptions on File Prior to Visit  Medication Sig Dispense Refill  . glucose blood (ACCU-CHEK AVIVA) test strip 1 each by Other route as needed. Use as instructed       . Insulin Syringe-Needle U-100 (B-D INS SYR ULTRAFINE 1CC/31G) 31G X 5/16" 1 ML MISC 1 Units by Does not apply route daily.  100 each  11  . omeprazole (PRILOSEC) 20 MG capsule Take 20 mg by mouth daily.        . Saxagliptin-Metformin 09-998 MG TB24 Take 1 tablet by mouth daily.  30 tablet  6   No current facility-administered medications on file prior to visit.    BP 130/80  Pulse 76  Temp(Src) 98 F (36.7 C)  Resp 16  Ht 6\' 3"  (1.905 m)  Wt 292 lb (132.45 kg)  BMI 36.5 kg/m2       Objective:   Physical Exam  Nursing note and vitals reviewed. Constitutional: He appears well-developed and well-nourished.  HENT:  Head: Normocephalic and atraumatic.  Eyes: Conjunctivae are normal. Pupils are equal, round, and reactive to light.  Neck: Normal range of motion. Neck supple.  Cardiovascular: Normal rate and regular rhythm.   Pulmonary/Chest:  Effort normal and breath sounds normal.  Abdominal: Soft. Bowel sounds are normal.          Assessment & Plan:  We will gradually increase the patient's Lantus to achieve a fasting blood glucose with a 95-110 range.  Work on continued exercise and weight reduction he has lost 10 pounds since his last office visit.  Continue current oral medication because it seems to suppress his appetite.  Continue use of sleep apnea machine

## 2012-12-26 NOTE — Patient Instructions (Signed)
CBGs 10 on average between 90 and 110 after you have been on the Lantus 30 for 2 weeks increase it to 35

## 2013-04-17 ENCOUNTER — Other Ambulatory Visit (INDEPENDENT_AMBULATORY_CARE_PROVIDER_SITE_OTHER): Payer: BC Managed Care – PPO

## 2013-04-17 DIAGNOSIS — E1110 Type 2 diabetes mellitus with ketoacidosis without coma: Secondary | ICD-10-CM

## 2013-04-17 DIAGNOSIS — E1310 Other specified diabetes mellitus with ketoacidosis without coma: Secondary | ICD-10-CM

## 2013-04-17 LAB — HEMOGLOBIN A1C: Hgb A1c MFr Bld: 9 % — ABNORMAL HIGH (ref 4.6–6.5)

## 2013-04-24 ENCOUNTER — Ambulatory Visit (INDEPENDENT_AMBULATORY_CARE_PROVIDER_SITE_OTHER): Payer: BC Managed Care – PPO | Admitting: Internal Medicine

## 2013-04-24 ENCOUNTER — Encounter: Payer: Self-pay | Admitting: Internal Medicine

## 2013-04-24 VITALS — BP 130/90 | HR 80 | Temp 98.2°F | Resp 18 | Ht 75.0 in | Wt 296.0 lb

## 2013-04-24 DIAGNOSIS — E1165 Type 2 diabetes mellitus with hyperglycemia: Secondary | ICD-10-CM

## 2013-04-24 DIAGNOSIS — E1310 Other specified diabetes mellitus with ketoacidosis without coma: Secondary | ICD-10-CM

## 2013-04-24 DIAGNOSIS — E1110 Type 2 diabetes mellitus with ketoacidosis without coma: Secondary | ICD-10-CM

## 2013-04-24 MED ORDER — INSULIN GLARGINE 100 UNIT/ML SOLOSTAR PEN: 35.0000 [IU] | PEN_INJECTOR | Freq: Every day | SUBCUTANEOUS | Status: DC

## 2013-04-24 MED ORDER — INSULIN GLARGINE 100 UNIT/ML SOLOSTAR PEN: 30.0000 [IU] | PEN_INJECTOR | Freq: Every day | SUBCUTANEOUS | Status: DC

## 2013-04-24 MED ORDER — SAXAGLIPTIN-METFORMIN ER 5-1000 MG PO TB24: 1.0000 | ORAL_TABLET | Freq: Every day | ORAL | Status: DC

## 2013-04-24 NOTE — Patient Instructions (Signed)
The patient is instructed to continue all medications as prescribed. Schedule followup with check out clerk upon leaving the clinic  

## 2013-04-24 NOTE — Progress Notes (Signed)
Pre visit review using our clinic review tool, if applicable. No additional management support is needed unless otherwise documented below in the visit note. 

## 2013-04-24 NOTE — Progress Notes (Signed)
   Subjective:    Patient ID: Wayne Ho, male    DOB: 05/01/1971, 42 y.o.   MRN: [id number]  HPI The patient has been on lantus and kombiglyze and has noted CBG are dropping into the 120-140 range from the 200-250 range Discussion of the use of the lantus and the titration of the lantus for control   Review of Systems  Constitutional: Negative for fever and fatigue.  HENT: Negative for congestion, hearing loss and postnasal drip.   Eyes: Negative for discharge, redness and visual disturbance.  Respiratory: Negative for cough, shortness of breath and wheezing.   Cardiovascular: Negative for leg swelling.  Gastrointestinal: Negative for abdominal pain, constipation and abdominal distention.  Genitourinary: Negative for urgency and frequency.  Musculoskeletal: Negative for arthralgias, joint swelling and neck pain.  Skin: Negative for color change and rash.  Neurological: Negative for weakness and light-headedness.  Hematological: Negative for adenopathy.  Psychiatric/Behavioral: Negative for behavioral problems.       Objective:   Physical Exam  Constitutional: He appears well-developed and well-nourished.  HENT:  Head: Normocephalic and atraumatic.  Eyes: Conjunctivae are normal. Pupils are equal, round, and reactive to light.  Neck: Normal range of motion. Neck supple.  Cardiovascular: Normal rate and regular rhythm.   Pulmonary/Chest: Effort normal and breath sounds normal.  Abdominal: Soft. Bowel sounds are normal.          Assessment & Plan:  Slow titration of Lantus for better control A1c has dropped 1 point his goal is continued drop the A1c until we reached the sevens

## 2013-05-15 ENCOUNTER — Other Ambulatory Visit: Payer: Self-pay | Admitting: *Deleted

## 2013-05-15 DIAGNOSIS — E1110 Type 2 diabetes mellitus with ketoacidosis without coma: Secondary | ICD-10-CM

## 2013-05-15 MED ORDER — SAXAGLIPTIN-METFORMIN ER 5-1000 MG PO TB24: 1.0000 | ORAL_TABLET | Freq: Every day | ORAL | Status: DC

## 2013-05-15 MED ORDER — INSULIN GLARGINE 100 UNIT/ML SOLOSTAR PEN: 35.0000 [IU] | PEN_INJECTOR | Freq: Every day | SUBCUTANEOUS | Status: DC

## 2013-08-30 ENCOUNTER — Other Ambulatory Visit: Payer: BC Managed Care – PPO

## 2013-09-06 ENCOUNTER — Ambulatory Visit: Payer: BC Managed Care – PPO | Admitting: Internal Medicine

## 2013-12-06 ENCOUNTER — Other Ambulatory Visit: Payer: Self-pay | Admitting: Family Medicine

## 2013-12-06 DIAGNOSIS — E1169 Type 2 diabetes mellitus with other specified complication: Principal | ICD-10-CM

## 2013-12-06 DIAGNOSIS — E1165 Type 2 diabetes mellitus with hyperglycemia: Secondary | ICD-10-CM

## 2013-12-06 DIAGNOSIS — IMO0002 Reserved for concepts with insufficient information to code with codable children: Secondary | ICD-10-CM

## 2013-12-12 ENCOUNTER — Other Ambulatory Visit (INDEPENDENT_AMBULATORY_CARE_PROVIDER_SITE_OTHER): Payer: 59

## 2013-12-12 DIAGNOSIS — IMO0002 Reserved for concepts with insufficient information to code with codable children: Secondary | ICD-10-CM

## 2013-12-12 DIAGNOSIS — E1169 Type 2 diabetes mellitus with other specified complication: Principal | ICD-10-CM

## 2013-12-12 DIAGNOSIS — E1165 Type 2 diabetes mellitus with hyperglycemia: Secondary | ICD-10-CM

## 2013-12-12 DIAGNOSIS — E785 Hyperlipidemia, unspecified: Secondary | ICD-10-CM

## 2013-12-12 LAB — BASIC METABOLIC PANEL
BUN: 12 mg/dL (ref 6–23)
CALCIUM: 9.5 mg/dL (ref 8.4–10.5)
CO2: 26 meq/L (ref 19–32)
CREATININE: 0.9 mg/dL (ref 0.4–1.5)
Chloride: 100 mEq/L (ref 96–112)
GFR: 94.54 mL/min (ref >60.00)
Glucose, Bld: 304 mg/dL — ABNORMAL HIGH (ref 70–99)
Potassium: 3.9 mEq/L (ref 3.5–5.1)
SODIUM: 134 meq/L — AB (ref 135–145)

## 2013-12-12 LAB — LIPID PANEL
CHOLESTEROL: 169 mg/dL (ref 0–200)
HDL: 31.9 mg/dL — AB (ref >39.00)
NonHDL: 137.1
Total CHOL/HDL Ratio: 5
Triglycerides: 249 mg/dL — ABNORMAL HIGH (ref 0.0–149.0)
VLDL: 49.8 mg/dL — AB (ref 0.0–40.0)

## 2013-12-12 LAB — MICROALBUMIN / CREATININE URINE RATIO
Creatinine,U: 116.9 mg/dL
MICROALB/CREAT RATIO: 2.5 mg/g (ref 0.0–30.0)
Microalb, Ur: 2.9 mg/dL — ABNORMAL HIGH (ref 0.0–1.9)

## 2013-12-12 LAB — LDL CHOLESTEROL, DIRECT: Direct LDL: 115.1 mg/dL

## 2013-12-12 LAB — HEMOGLOBIN A1C: Hgb A1c MFr Bld: 10.7 % — ABNORMAL HIGH (ref 4.6–6.5)

## 2013-12-27 ENCOUNTER — Ambulatory Visit (INDEPENDENT_AMBULATORY_CARE_PROVIDER_SITE_OTHER): Payer: 59 | Admitting: Family Medicine

## 2013-12-27 ENCOUNTER — Encounter: Payer: Self-pay | Admitting: Family Medicine

## 2013-12-27 VITALS — BP 134/96 | HR 88 | Temp 98.3°F | Wt 294.0 lb

## 2013-12-27 DIAGNOSIS — K208 Other esophagitis without bleeding: Secondary | ICD-10-CM

## 2013-12-27 DIAGNOSIS — I10 Essential (primary) hypertension: Secondary | ICD-10-CM

## 2013-12-27 DIAGNOSIS — E1110 Type 2 diabetes mellitus with ketoacidosis without coma: Secondary | ICD-10-CM

## 2013-12-27 DIAGNOSIS — E1310 Other specified diabetes mellitus with ketoacidosis without coma: Secondary | ICD-10-CM

## 2013-12-27 DIAGNOSIS — G4733 Obstructive sleep apnea (adult) (pediatric): Secondary | ICD-10-CM

## 2013-12-27 DIAGNOSIS — E1169 Type 2 diabetes mellitus with other specified complication: Secondary | ICD-10-CM

## 2013-12-27 DIAGNOSIS — E785 Hyperlipidemia, unspecified: Secondary | ICD-10-CM

## 2013-12-27 DIAGNOSIS — IMO0002 Reserved for concepts with insufficient information to code with codable children: Secondary | ICD-10-CM

## 2013-12-27 DIAGNOSIS — E1165 Type 2 diabetes mellitus with hyperglycemia: Secondary | ICD-10-CM

## 2013-12-27 HISTORY — DX: Essential (primary) hypertension: I10

## 2013-12-27 HISTORY — DX: Hyperlipidemia, unspecified: E78.5

## 2013-12-27 MED ORDER — SAXAGLIPTIN-METFORMIN ER 5-1000 MG PO TB24: 1.0000 | ORAL_TABLET | Freq: Every day | ORAL | Status: DC

## 2013-12-27 NOTE — Assessment & Plan Note (Signed)
LDL elevated at 115. Recommended statin based off diabetes but patient is not interested. He wants to focus on diet exercise and getting A1c down first. I am hopeful he can get his LDL down with these changes.

## 2013-12-27 NOTE — Assessment & Plan Note (Signed)
Poorly controlled with increasing a1c now >10. He has been out of saxagliptin-metformin for at least 1.5 months. Restart oral medication. Continue Lantus 35 units. He will contact me in 2 weeks with fasting blood sugars to discuss titration. We'll see back in one month. Advised regular exercise and need for drastic weight loss to prevent progression to needing mealtime insulin. Patient refused pneumonia vaccine today.

## 2013-12-27 NOTE — Assessment & Plan Note (Addendum)
2 consecutive readings with diastolic elevated. Discussed medication. Patient would like to work on diet and exercise and return in one month. I do believe this is potentially reversible without medication.

## 2013-12-27 NOTE — Patient Instructions (Signed)
Diabetes  A1c of 10.7  Start back saxagliptin-metformin. Call me if it is too expensive.  Possible metformin extended release would be less expensive (could ask pharmacy) and then add saxagliptin or januvia (similar meds)  Continue Lantus 35 units  Call me in 2 weeks to let me know what your numbers are. We may need to adjust your Lantus based off numbers.   3rd medicine-add 150 minutes exercise per week  If you want to avoid mealtime insulin, we need to really focus on weight loss  Blood pressure  Bottom number up  Exercise should help   Recheck 1 month  Cholesterol  High and would recommend a statin  You decided to focus on a1c number today  1 month follow up,  Dr. Durene CalHunter

## 2013-12-27 NOTE — Progress Notes (Signed)
Tana Conch, MD Phone: [phone]  Subjective:   Wayne Ho is a 43 y.o. year old very pleasant male patient who presents F/u DM bundle labs:  DIABETES Type II Lab Results  Component Value Date   HGBA1C 10.7* 12/12/2013   HGBA1C 9.0* 04/17/2013   HGBA1C 9.8* 12/17/2012   Medications taking and tolerating-Lantus 35 units alone, ran out of oral medications 1.5-2 months ago.  Blood Sugars per patient-fasting- 125-300 Diet-wife trying to help him, she is an Char[employer] fundraiser Regular Exercise-occasionally walks  Health Maintenance Due  Topic Date Due  . Pneumococcal Polysaccharide Vaccine (##1)- patient refuses 11/26/1970  On Aspirin-no, advised needs to take On statin-no, refuses Daily foot monitoring-yes  ROS- Denies Polyuria,Polydipsia. Occasional nocturia. Denies, Vision changes, feet or hand numbness/pain/tingling. Denies Hypoglycemia symptoms (shaky, sweaty, hungry, weak anxious, tremor, palpitations, confusion, behavior change).   Hypertension  BP Readings from Last 3 Encounters:  12/27/13 134/96  04/24/13 130/90  12/26/12 130/80  Home BP monitoring-no Compliant with medications-not on medications ROS-Denies any CP, HA, SOB, blurry vision, LE edema.  Hyperlipidemia On statin: no Regular exercise: no, onccasional Diet: working on diet changes ROS- no chest pain or shortness of breath. No myalgias  Past Medical History- Patient Active Problem List   Diagnosis Date Noted  . Type II or unspecified type diabetes mellitus with other specified manifestations, uncontrolled 07/27/2009    Priority: High  . Essential hypertension, benign 12/27/2013    Priority: Medium  . Hyperlipidemia 12/27/2013    Priority: Medium  . Obesity 02/18/2010    Priority: Medium  . OBSTRUCTIVE SLEEP APNEA 11/02/2007    Priority: Medium  . ESOPHAGITIS NEC 12/19/2006    Priority: Low   Medications- reviewed and updated Current Outpatient Prescriptions  Medication Sig Dispense Refill  . aspirin  81 MG tablet Take 81 mg by mouth daily.      Marland Kitchen glucose blood (ACCU-CHEK AVIVA) test strip 1 each by Other route as needed. Use as instructed       . Insulin Glargine (LANTUS SOLOSTAR) 100 UNIT/ML Solostar Pen Inject 35 Units into the skin at bedtime.  6 pen  3  . omeprazole (PRILOSEC) 20 MG capsule Take 20 mg by mouth daily.        . Saxagliptin-Metformin 09-998 MG TB24 Take 1 tablet by mouth daily.  90 tablet  3   No current facility-administered medications for this visit.    Objective: BP 134/96  Pulse 88  Temp(Src) 98.3 F (36.8 C)  Wt 294 lb (133.358 kg) Gen: NAD, resting comfortably. Obese. CV: RRR no murmurs rubs or gallops Lungs: CTAB no crackles, wheeze, rhonchi Ext: no edema DM foot exam normal see flowsheet  Assessment/Plan:  Type II or unspecified type diabetes mellitus with other specified manifestations, uncontrolled Poorly controlled with increasing a1c now >10. He has been out of saxagliptin-metformin for at least 1.5 months. Restart oral medication. Continue Lantus 35 units. He will contact me in 2 weeks with fasting blood sugars to discuss titration. We'll see back in one month. Advised regular exercise and need for drastic weight loss to prevent progression to needing mealtime insulin. Patient refused pneumonia vaccine today.  Essential hypertension, benign 2 consecutive readings with diastolic elevated. Discussed medication. Patient would like to work on diet and exercise and return in one month. I do believe this is potentially reversible without medication.  Hyperlipidemia LDL elevated at 115. Recommended statin based off diabetes but patient is not interested. He wants to focus on diet exercise and getting A1c  down first. I am hopeful he can get his LDL down with these changes.   Meds ordered this encounter  Medications  . Saxagliptin-Metformin 09-998 MG TB24    Sig: Take 1 tablet by mouth daily.    Dispense:  90 tablet    Refill:  3  . aspirin 81 MG  tablet    Sig: Take 81 mg by mouth daily.

## 2014-02-20 ENCOUNTER — Telehealth: Payer: Self-pay | Admitting: Family Medicine

## 2014-02-20 NOTE — Telephone Encounter (Signed)
Please ask patient to keep current appointment. I did not have a chance to go through his full history which is the idea of the establish appointment. I asked him to see me in 1 month from august visit so really would like to see him. Would like if he cant make this to move 30 minute appointment to another day within next 6 weeks.

## 2014-02-20 NOTE — Telephone Encounter (Signed)
Pt called and needed to cancel his appt for 02/21/14 with Dr. Durene CalHunter.  The appt was the initial transfer appt but pt states he is already established with Dr. Durene CalHunter and his next appt should have been for labs then f/u.  Please advise what type of appointment pt needs.

## 2014-02-21 ENCOUNTER — Ambulatory Visit: Payer: 59 | Admitting: Family Medicine

## 2014-02-21 NOTE — Telephone Encounter (Signed)
LMOM for pt regarding Dr. Erasmo LeventhalHunter's reply below.

## 2014-08-05 ENCOUNTER — Telehealth: Payer: Self-pay | Admitting: Family Medicine

## 2014-08-05 DIAGNOSIS — E118 Type 2 diabetes mellitus with unspecified complications: Secondary | ICD-10-CM

## 2014-08-05 NOTE — Telephone Encounter (Signed)
I am fine with a1c previsit. Still needs establish 30 minute visit though

## 2014-08-05 NOTE — Telephone Encounter (Signed)
Pt had an est appt in oct but had to cancel. Pt states you discussed history at a fup in august 2015. So now is not wanting to make another est appt, pt wants an order for a A1C , and a fup w/ you to discuss.  I offered to make another fup on meds, but pt insists labs pre appt. So, offered to ask you if that is ok and pt was ok w/ that. pls advise thanks

## 2014-08-06 NOTE — Telephone Encounter (Signed)
Is it ok to create him a 30 min in order to get him in?

## 2014-08-06 NOTE — Telephone Encounter (Signed)
ok 

## 2014-08-06 NOTE — Telephone Encounter (Signed)
A1c entered,please inform Pt that he will still need 30 min est visit per Dr.Hunter Mrs.Olegario MessierKathy

## 2014-08-07 NOTE — Telephone Encounter (Signed)
Lm on vm for pt.to cb °

## 2014-08-12 NOTE — Telephone Encounter (Signed)
Left another message on VM.

## 2014-08-14 NOTE — Telephone Encounter (Signed)
Pt has been scheduled.  °

## 2014-08-21 ENCOUNTER — Other Ambulatory Visit (INDEPENDENT_AMBULATORY_CARE_PROVIDER_SITE_OTHER): Payer: 59

## 2014-08-21 DIAGNOSIS — E118 Type 2 diabetes mellitus with unspecified complications: Secondary | ICD-10-CM

## 2014-08-21 LAB — HEMOGLOBIN A1C: Hgb A1c MFr Bld: 7.9 % — ABNORMAL HIGH (ref 4.6–6.5)

## 2014-09-10 ENCOUNTER — Telehealth: Payer: Self-pay

## 2014-09-10 NOTE — Telephone Encounter (Signed)
[facility] request for Insulin Glargine (LANTUS SOLOSTAR) 100 UNIT/ML Solostar Pen

## 2014-09-11 MED ORDER — INSULIN GLARGINE 100 UNIT/ML SOLOSTAR PEN: 35.0000 [IU] | PEN_INJECTOR | Freq: Every day | SUBCUTANEOUS | Status: DC

## 2014-09-11 NOTE — Telephone Encounter (Signed)
Medication refilled

## 2014-10-03 ENCOUNTER — Ambulatory Visit: Payer: 59 | Admitting: Family Medicine

## 2014-10-17 ENCOUNTER — Ambulatory Visit (INDEPENDENT_AMBULATORY_CARE_PROVIDER_SITE_OTHER): Payer: 59 | Admitting: Family Medicine

## 2014-10-17 ENCOUNTER — Encounter: Payer: Self-pay | Admitting: Family Medicine

## 2014-10-17 VITALS — BP 144/88 | HR 91 | Temp 98.5°F | Wt 299.0 lb

## 2014-10-17 DIAGNOSIS — E785 Hyperlipidemia, unspecified: Secondary | ICD-10-CM | POA: Diagnosis not present

## 2014-10-17 DIAGNOSIS — E118 Type 2 diabetes mellitus with unspecified complications: Secondary | ICD-10-CM | POA: Diagnosis not present

## 2014-10-17 DIAGNOSIS — N529 Male erectile dysfunction, unspecified: Secondary | ICD-10-CM | POA: Insufficient documentation

## 2014-10-17 DIAGNOSIS — R21 Rash and other nonspecific skin eruption: Secondary | ICD-10-CM | POA: Insufficient documentation

## 2014-10-17 DIAGNOSIS — I10 Essential (primary) hypertension: Secondary | ICD-10-CM | POA: Diagnosis not present

## 2014-10-17 HISTORY — DX: Male erectile dysfunction, unspecified: N52.9

## 2014-10-17 HISTORY — DX: Rash and other nonspecific skin eruption: R21

## 2014-10-17 MED ORDER — TADALAFIL 20 MG PO TABS: 10.0000 mg | ORAL_TABLET | ORAL | Status: DC | PRN

## 2014-10-17 NOTE — Assessment & Plan Note (Signed)
S: LDL 115 in diabetic. No statin. Gaining weight A/P: discussed need for statin unless could lose weight and likely get trig <200 and LDL <100. Patient wants to work on this before statin. See AVS.

## 2014-10-17 NOTE — Patient Instructions (Addendum)
Have eye exam results faxed to [place] at [phone] Sign release of information at the front desk for these alternatively.   We will call you within a week about your referral to dermatology. If you do not hear within 2 weeks, give [place] a call.    For diabetes, let's increase to 37 units lantus each night. Send me a long 2 weeks from now with your sugars each morning.   Blood pressure is elevated in range that we could use medicine  Cholesterol is elevated in range that we could use medicine  You would prefer to work on diet/exercise/weight loss and I think that is a reasonable step: 1. Exercise 150 minutes a week.  2. Try not to skip breakfast as likely slows metabolism 3. Focus on drinking water only, 1 cup of milk a day ok  If we are able to get weight down to 290, consider short term use of appetite suppressant   Trial Cialis   Try to meet sometime after 11/20/14. a1c before appointment but after this date

## 2014-10-17 NOTE — Assessment & Plan Note (Signed)
S: Compliant with Lantus 35 units. saxliptin-metformin 5-1000mg  daily. for month of may sugars averaged 187 in the mornings. Lowest in AM right at 100.   Lab Results  Component Value Date   HGBA1C 7.9* 08/21/2014  A/P: improved control down from 10.7. Titrate lantus to 37 units. Continue  saxliptin-metformin 5-1000mg  daily though.mychart message in 2 weeks to further titrate insulin. Follow up in person 1 month with repeat a1c (woudl not be surprised if higher based off fasting cbg average)  could add another 1000mg  metformin potentially. Also considering invokana.

## 2014-10-17 NOTE — Assessment & Plan Note (Signed)
S: States rash started after going on the saxagliptin- metformin combination. No prior issues when on them individually. On neck, on upper chest, under chest, in axilla, in groin. More obvious in warm temperatures, improves in cool like cold shower.  O: tan to brown extensive plaque bilateral axilla that appears to be from coalescing circles or ovals. Has similar rash that has almost fine scale on neck but on his trunk- upper chest and under chest it has a deep red to it. Dpes not itch. Has tried steroids on it in the past without any help.  A/P: Patient states he was told this was diabetes related previously. This is not acanthosis nigricans. I am unsure of etiology and have referred to dermatology at this time.

## 2014-10-17 NOTE — Assessment & Plan Note (Signed)
S: can attain but not sustain erection A/P: trial cialis

## 2014-10-17 NOTE — Progress Notes (Signed)
Wayne Conch, MD Phone: [phone]  Subjective:  Patient presents today to establish care with me as their new primary care provider. Patient was formerly a patient of Dr. Lovell Sheehan. Chief complaint-noted.   See Problem oriented charting ROS- no hypoglycemia or blurry vision. No chest pain or shortness of breath. not ill appearing, no fever/chills. No new medications except combo pill instead of individual elements. Not immunocompromised. No mucus membrane involvement.   The following were reviewed: Past Medical History  Diagnosis Date  . DIABETES MELLITUS, TYPE II, CONTROLLED, MILD 07/27/2009  . ESOPHAGITIS NEC 12/19/2006  . OBSTRUCTIVE SLEEP APNEA 11/02/2007  . Chronic chest pain    Patient Active Problem List   Diagnosis Date Noted  . Type 2 diabetes mellitus with manifestations 07/27/2009    Priority: High  . Essential hypertension, benign 12/27/2013    Priority: Medium  . Hyperlipidemia 12/27/2013    Priority: Medium  . Erectile dysfunction 10/17/2014    Priority: Low  . Obesity 02/18/2010    Priority: Low  . Obstructive sleep apnea 11/02/2007    Priority: Low  . GERD (gastroesophageal reflux disease) 12/19/2006    Priority: Low  . Rash and nonspecific skin eruption 10/17/2014   Past Surgical History  Procedure Laterality Date  . Cholecystectomy  11/07  . Anterior cruciate ligament repair      both knees    Family History  Problem Relation Age of Onset  . Coronary artery disease    . Hypertension Mother   . Heart disease Father   . Early death Father     Medications- reviewed and updated Current Outpatient Prescriptions  Medication Sig Dispense Refill  . aspirin 81 MG tablet Take 81 mg by mouth daily.    Marland Kitchen glucose blood (ACCU-CHEK AVIVA) test strip 1 each by Other route as needed. Use as instructed     . Insulin Glargine (LANTUS SOLOSTAR) 100 UNIT/ML Solostar Pen Inject 35 Units into the skin at bedtime. 6 pen 3  . omeprazole (PRILOSEC) 20 MG capsule Take  20 mg by mouth daily.      . Saxagliptin-Metformin 09-998 MG TB24 Take 1 tablet by mouth daily. 90 tablet 3   Allergies-reviewed and updated No Known Allergies  History   Social History  . Marital Status: Married    Spouse Name: N/A  . Number of Children: N/A  . Years of Education: N/A   Social History Main Topics  . Smoking status: Never Smoker   . Smokeless tobacco: Not on file  . Alcohol Use: No  . Drug Use: No  . Sexual Activity: Yes   Other Topics Concern  . None   Social History Narrative    ROS--See HPI   Objective: BP 144/88 mmHg  Pulse 91  Temp(Src) 98.5 F (36.9 C)  Wt 299 lb (135.626 kg) Gen: NAD, resting comfortably HEENT: Mucous membranes are moist. Oropharynx normal CV: RRR no murmurs rubs or gallops Lungs: CTAB no crackles, wheeze, rhonchi Abdomen: soft/nontender/nondistended/normal bowel sounds. No rebound or guarding.  Ext: no edema Skin: see rash problem oriented charting Neuro: grossly normal, moves all extremities, PERRLA  Assessment/Plan:  Type 2 diabetes mellitus with manifestations S: Compliant with Lantus 35 units. saxliptin-metformin 5-1000mg  daily. for month of may sugars averaged 187 in the mornings. Lowest in AM right at 100.   Lab Results  Component Value Date   HGBA1C 7.9* 08/21/2014  A/P: improved control down from 10.7. Titrate lantus to 37 units. Continue  saxliptin-metformin 5-1000mg  daily though.mychart message in 2 weeks  to further titrate insulin. Follow up in person 1 month with repeat a1c (woudl not be surprised if higher based off fasting cbg average)  could add another  metformin potentially. Also considering invokana.    Hyperlipidemia S: LDL 115 in diabetic. No statin. Gaining weight A/P: discussed need for statin unless could lose weight and likely get trig <200 and LDL <100. Patient wants to work on this before statin. See AVS.    Essential hypertension, benign S: poor control today SBP for first time  with weight gain, DBP poor control in past A/P: advised could start BP agent vs. Work on diet/liefestyle/weight loss and he wants to work on the latter- follow up one month.    Erectile dysfunction S: can attain but not sustain erection A/P: trial cialis   Rash and nonspecific skin eruption S: States rash started after going on the saxagliptin- metformin combination. No prior issues when on them individually. On neck, on upper chest, under chest, in axilla, in groin. More obvious in warm temperatures, improves in cool like cold shower.  O: tan to brown extensive plaque bilateral axilla that appears to be from coalescing circles or ovals. Has similar rash that has almost fine scale on neck but on his trunk- upper chest and under chest it has a deep red to it. Dpes not itch. Has tried steroids on it in the past without any help.  A/P: Patient states he was told this was diabetes related previously. This is not acanthosis nigricans. I am unsure of etiology and have referred to dermatology at this time.    1 month follow up. Still needs history tab updated  Orders Placed This Encounter  Procedures  . Hemoglobin A1c    [place]    Standing Status: Future     Number of Occurrences:      Standing Expiration Date: 10/17/2015  . Ambulatory referral to Dermatology    Referral Priority:  Routine    Referral Type:  Consultation    Referral Reason:  Specialty Services Required    Requested Specialty:  Dermatology    Number of Visits Requested:  1    Meds ordered this encounter  Medications  . tadalafil (CIALIS) 20 MG tablet    Sig: Take 0.5-1 tablets (10-20 mg total) by mouth every other day as needed for erectile dysfunction.    Dispense:  5 tablet    Refill:  1

## 2014-10-17 NOTE — Assessment & Plan Note (Signed)
S: poor control today SBP for first time with weight gain, DBP poor control in past A/P: advised could start BP agent vs. Work on diet/liefestyle/weight loss and he wants to work on the latter- follow up one month.

## 2014-11-24 ENCOUNTER — Other Ambulatory Visit (INDEPENDENT_AMBULATORY_CARE_PROVIDER_SITE_OTHER): Payer: 59

## 2014-11-24 DIAGNOSIS — E118 Type 2 diabetes mellitus with unspecified complications: Secondary | ICD-10-CM

## 2014-11-24 LAB — HEMOGLOBIN A1C: Hgb A1c MFr Bld: 9.8 % — ABNORMAL HIGH (ref 4.6–6.5)

## 2014-11-28 ENCOUNTER — Ambulatory Visit: Payer: 59 | Admitting: Family Medicine

## 2014-12-17 ENCOUNTER — Ambulatory Visit: Payer: 59 | Admitting: Family Medicine

## 2015-01-01 ENCOUNTER — Ambulatory Visit: Payer: 59 | Admitting: Family Medicine

## 2015-01-16 ENCOUNTER — Other Ambulatory Visit: Payer: Self-pay | Admitting: Family Medicine

## 2015-01-19 ENCOUNTER — Ambulatory Visit (INDEPENDENT_AMBULATORY_CARE_PROVIDER_SITE_OTHER): Payer: 59 | Admitting: Family Medicine

## 2015-01-19 ENCOUNTER — Encounter: Payer: Self-pay | Admitting: Family Medicine

## 2015-01-19 VITALS — BP 132/88 | HR 84 | Temp 98.8°F | Wt 294.0 lb

## 2015-01-19 DIAGNOSIS — E785 Hyperlipidemia, unspecified: Secondary | ICD-10-CM | POA: Diagnosis not present

## 2015-01-19 DIAGNOSIS — I10 Essential (primary) hypertension: Secondary | ICD-10-CM

## 2015-01-19 DIAGNOSIS — E118 Type 2 diabetes mellitus with unspecified complications: Secondary | ICD-10-CM

## 2015-01-19 LAB — POCT URINALYSIS DIPSTICK
BILIRUBIN UA: NEGATIVE
LEUKOCYTES UA: NEGATIVE
Nitrite, UA: NEGATIVE
PH UA: 5.5
Protein, UA: NEGATIVE
RBC UA: NEGATIVE
SPEC GRAV UA: 1.02
Urobilinogen, UA: 0.2

## 2015-01-19 LAB — MICROALBUMIN / CREATININE URINE RATIO
CREATININE, U: 101.2 mg/dL
MICROALB UR: 3.2 mg/dL — AB (ref 0.0–1.9)
MICROALB/CREAT RATIO: 3.2 mg/g (ref 0.0–30.0)

## 2015-01-19 MED ORDER — METFORMIN HCL 1000 MG PO TABS: 1000.0000 mg | ORAL_TABLET | Freq: Every day | ORAL | Status: DC

## 2015-01-19 NOTE — Assessment & Plan Note (Signed)
S: minimal weight loss but improved habits A/P: patient would like to wait to check lipids until his weight has dropped as he does not want to start statin until he gives this a good chance. Encouraged need for healthy eating, regular exercise, weight loss.

## 2015-01-19 NOTE — Progress Notes (Signed)
Wayne Conch, MD  Subjective:  Wayne Ho is a 44 y.o. year old very pleasant male patient who presents for/with See problem oriented charting ROS- cough, congestion, mild shortness of breath several weeks ago- believes he had bronchitis, issues are essentially resolved except for occasional cough at this point. No chest pain, shortness of breath. No blurry vision, hypoglycemia.   Past Medical History-  Patient Active Problem List   Diagnosis Date Noted  . Type 2 diabetes mellitus with manifestations 07/27/2009    Priority: High  . Essential hypertension, benign 12/27/2013    Priority: Medium  . Hyperlipidemia 12/27/2013    Priority: Medium  . Erectile dysfunction 10/17/2014    Priority: Low  . Obesity 02/18/2010    Priority: Low  . Obstructive sleep apnea 11/02/2007    Priority: Low  . GERD (gastroesophageal reflux disease) 12/19/2006    Priority: Low  . Rash and nonspecific skin eruption 10/17/2014    Medications- reviewed and updated Current Outpatient Prescriptions  Medication Sig Dispense Refill  . aspirin 81 MG tablet Take 81 mg by mouth daily.    Marland Kitchen glucose blood (ACCU-CHEK AVIVA) test strip 1 each by Other route as needed. Use as instructed     . Insulin Glargine (LANTUS SOLOSTAR) 100 UNIT/ML Solostar Pen Inject 35 Units into the skin at bedtime. 6 pen 3  . KOMBIGLYZE XR 09-998 MG TB24 TAKE 1 TABLET BY MOUTH DAILY. 90 tablet 3  . omeprazole (PRILOSEC) 20 MG capsule Take 20 mg by mouth daily.      . tadalafil (CIALIS) 20 MG tablet Take 0.5-1 tablets (10-20 mg total) by mouth every other day as needed for erectile dysfunction. (Patient not taking: Reported on 01/19/2015) 5 tablet 1   Objective: BP 132/88 mmHg  Pulse 84  Temp(Src) 98.8 F (37.1 C)  Wt 294 lb (133.358 kg) Gen: NAD, resting comfortably CV: RRR no murmurs rubs or gallops Lungs: CTAB no crackles, wheeze, rhonchi Abdomen: soft/nontender/nondistended/normal bowel sounds. No rebound or guarding.  Obese Ext: no edema Skin: warm, dry Neuro: grossly normal, moves all extremities  Assessment/Plan:  Type 2 diabetes mellitus with manifestations S: suspect poorly controlled. Eating 6 meals a day instead of 3. Averaging 160s and 170s. In the morning. Has had some in 90s or 100s. This is an improvement from around time of last a1c which was 9.8 when he was not taking his Komiglyze but was taking 35 lantus.Riding bike- 3 weeks.  Lab Results  Component Value Date   HGBA1C 9.8* 11/24/2014  A/P:Continue current meds:  AM sugars still too high, not on max dose metformin so will add evening dose. In addition, get a1c in about 6 weeks. Consider increasing Lantus by phone to titrate to likely 100-120 range- did not start with this as ranging from 90s to 170s in AM and hope this improves some on max dose metformin. Targeting weight of 250.    Essential hypertension, benign S: improved with diet/exercise changes though no weight loss yet. Not on medication currently BP Readings from Last 3 Encounters:  01/19/15 132/88  10/17/14 144/88  12/27/13 134/96  A/P: Initially diastolic elevated buc ontrolled on repeat- follow closely but hopeful can avoid medicine as patient works on weight loss   Hyperlipidemia S: minimal weight loss but improved habits A/P: patient would like to wait to check lipids until his weight has dropped as he does not want to start statin until he gives this a good chance. Encouraged need for healthy eating, regular exercise, weight  loss.    a1c late October or early November. Likely discuss by phone then follow up in person 3 months later.   Orders Placed This Encounter  Procedures  . Hemoglobin A1c    [place]    Standing Status: Future     Number of Occurrences:      Standing Expiration Date: 01/19/2016  . Microalbumin / creatinine urine ratio    [place]  . POC Urinalysis Dipstick    Meds ordered this encounter  Medications  . metFORMIN (GLUCOPHAGE) 1000 MG tablet     Sig: Take 1 tablet (1,000 mg total) by mouth daily with supper.    Dispense:  90 tablet    Refill:  3

## 2015-01-19 NOTE — Assessment & Plan Note (Signed)
S: suspect poorly controlled. Eating 6 meals a day instead of 3. Averaging 160s and 170s. In the morning. Has had some in 90s or 100s. This is an improvement from around time of last a1c which was 9.8 when he was not taking his Komiglyze but was taking 35 lantus.Riding bike- 3 weeks.  Lab Results  Component Value Date   HGBA1C 9.8* 11/24/2014  A/P:Continue current meds:  AM sugars still too high, not on max dose metformin so will add evening dose. In addition, get a1c in about 6 weeks. Consider increasing Lantus by phone to titrate to likely 100-120 range- did not start with this as ranging from 90s to 170s in AM and hope this improves some on max dose metformin. Targeting weight of 250.

## 2015-01-19 NOTE — Patient Instructions (Addendum)
Medication Instructions:  Add metformin $Remove dinner May need to bump insulin after a1c  Other Instructions:  Call and have eye exam records sent [place] at [phone].  Testing/Procedures/Immunizations: Advise a flu shot before end of November Health Maintenance Due  Topic Date Due  . HIV Screening  11/26/1983  . OPHTHALMOLOGY EXAM  01/21/2014  . INFLUENZA VACCINE  12/08/2014  . URINE MICROALBUMIN  12/13/2014  . FOOT EXAM  12/28/2014   Follow-Up (all visit scheduling, rescheduling, cancellations including labs should be scheduled at front desk): Return for a1c Last of oct, early nov Let's check in 3-4 months from now in person  Sooner if you need [place] or if you have new or worsening symptoms

## 2015-01-19 NOTE — Assessment & Plan Note (Signed)
S: improved with diet/exercise changes though no weight loss yet. Not on medication currently BP Readings from Last 3 Encounters:  01/19/15 132/88  10/17/14 144/88  12/27/13 134/96  A/P: Initially diastolic elevated buc ontrolled on repeat- follow closely but hopeful can avoid medicine as patient works on weight loss

## 2015-01-20 ENCOUNTER — Ambulatory Visit: Payer: 59 | Admitting: Family Medicine

## 2015-03-20 ENCOUNTER — Encounter: Payer: Self-pay | Admitting: Family Medicine

## 2015-03-20 ENCOUNTER — Ambulatory Visit (INDEPENDENT_AMBULATORY_CARE_PROVIDER_SITE_OTHER): Payer: 59 | Admitting: Family Medicine

## 2015-03-20 VITALS — HR 100 | Temp 98.7°F | Ht 75.0 in | Wt 294.0 lb

## 2015-03-20 DIAGNOSIS — S8012XD Contusion of left lower leg, subsequent encounter: Secondary | ICD-10-CM | POA: Diagnosis not present

## 2015-03-20 NOTE — Progress Notes (Signed)
   Subjective:    Patient ID: Wayne Ho, male    DOB: 13-Mar-1971, 44 y.o.   MRN: [id number]  HPI Here to check his left lower leg. On 03-14-15 while pitching softball he was hit by a line drive on the leg. It immediately swelled up so he iced it. The next day he went to an Urgent Care office and had an Xray taken, that was negative. He was given a week of Bactrim DS. Since then the leg has slowly improved. He has been up on it all day every day since then. The swelling and the pain has decreased a little. No numbness in the foot.    Review of Systems  Constitutional: Negative.   Respiratory: Negative.   Cardiovascular: Negative.   Skin: Positive for wound.       Objective:   Physical Exam  Constitutional: He appears well-developed and well-nourished.  Cardiovascular: Normal rate, regular rhythm, normal heart sounds and intact distal pulses.   Pulmonary/Chest: Effort normal and breath sounds normal.  Musculoskeletal:  The medial left lower leg is moderately swollen with a large contusion present. There are ecchymoses inferior to this down to the foot. The site is moderately tender but not warm. The foot has full sensation to touch and full ROM          Assessment & Plan:  This is a contusion that is slowly resolving. No signs of infection, though I advised him to finish out the antibiotic. There is a small risk of a compartment syndrome, but he shows no signs of this now. He will rest over the weekend, stay off his feet, and elevate the leg. He knows to follow up if the foot becomes gray or cool or numb.

## 2015-03-20 NOTE — Progress Notes (Signed)
Pre visit review using our clinic review tool, if applicable. No additional management support is needed unless otherwise documented below in the visit note. 

## 2015-06-17 ENCOUNTER — Other Ambulatory Visit: Payer: Self-pay | Admitting: Family Medicine

## 2015-06-17 MED FILL — CIALIS 20 MG TABLET: 20 | 25 days supply | Qty: 5 | Fill #0

## 2015-06-17 MED FILL — metFORMIN HCL 1000 MG TABS: 1000 | 90 days supply | Qty: 90 | Fill #1

## 2015-06-17 MED FILL — KOMBIGLYZE XR 5-1,000 MG TA: 5-1000 | 90 days supply | Qty: 90 | Fill #1

## 2015-06-17 MED FILL — LANTUS SOLOSTAR 100 UNITS/M: 100 | 85 days supply | Qty: 30 | Fill #2

## 2015-10-22 ENCOUNTER — Other Ambulatory Visit: Payer: Self-pay | Admitting: Family Medicine

## 2015-10-22 MED FILL — metFORMIN HCL 1000 MG TABS: 1000 | 90 days supply | Qty: 90 | Fill #2

## 2015-10-22 MED FILL — KOMBIGLYZE XR 5-1,000 MG TA: 5-1000 | 90 days supply | Qty: 90 | Fill #2

## 2015-10-22 MED FILL — LANTUS SOLOSTAR 100 UNITS/M: 100 | 85 days supply | Qty: 30 | Fill #0

## 2015-12-23 ENCOUNTER — Telehealth: Payer: Self-pay | Admitting: Family Medicine

## 2015-12-23 DIAGNOSIS — E118 Type 2 diabetes mellitus with unspecified complications: Secondary | ICD-10-CM

## 2015-12-23 NOTE — Telephone Encounter (Signed)
Pt states he was instructed to follow up by now, and is late in scheduling this appt. Pt would like to know if he should do labs prior to this visit.  Pt states you had wanted labs done, but I do not see an order. Please advise.

## 2015-12-25 NOTE — Telephone Encounter (Signed)
Orders have been entered. Could you please schedule him? Thanks!

## 2015-12-25 NOTE — Telephone Encounter (Signed)
Yes thanks, may order under diabetes  A1c, lipid panel, cbc, cmp, urine microalbumin/creatinine ratio

## 2015-12-30 NOTE — Telephone Encounter (Signed)
Left message to call back  

## 2015-12-30 NOTE — Telephone Encounter (Signed)
Pt has been scheduled.  °

## 2016-01-18 ENCOUNTER — Other Ambulatory Visit (INDEPENDENT_AMBULATORY_CARE_PROVIDER_SITE_OTHER): Payer: 59

## 2016-01-18 DIAGNOSIS — E118 Type 2 diabetes mellitus with unspecified complications: Secondary | ICD-10-CM

## 2016-01-18 LAB — CBC WITH DIFFERENTIAL/PLATELET
BASOS PCT: 0.6 % (ref 0.0–3.0)
Basophils Absolute: 0 10*3/uL (ref 0.0–0.1)
EOS ABS: 0.1 10*3/uL (ref 0.0–0.7)
EOS PCT: 2.4 % (ref 0.0–5.0)
HEMATOCRIT: 46.3 % (ref 39.0–52.0)
HEMOGLOBIN: 16 g/dL (ref 13.0–17.0)
LYMPHS PCT: 33.9 % (ref 12.0–46.0)
Lymphs Abs: 1.8 10*3/uL (ref 0.7–4.0)
MCHC: 34.6 g/dL (ref 30.0–36.0)
MCV: 87.8 fl (ref 78.0–100.0)
Monocytes Absolute: 0.5 10*3/uL (ref 0.1–1.0)
Monocytes Relative: 8.7 % (ref 3.0–12.0)
Neutro Abs: 3 10*3/uL (ref 1.4–7.7)
Neutrophils Relative %: 54.4 % (ref 43.0–77.0)
Platelets: 152 10*3/uL (ref 150.0–400.0)
RBC: 5.27 Mil/uL (ref 4.22–5.81)
RDW: 13.5 % (ref 11.5–15.5)
WBC: 5.4 10*3/uL (ref 4.0–10.5)

## 2016-01-18 LAB — LIPID PANEL
CHOLESTEROL: 139 mg/dL (ref 0–200)
HDL: 38.8 mg/dL — ABNORMAL LOW (ref >39.00)
LDL Cholesterol: 82 mg/dL (ref 0–99)
NonHDL: 100.63
Total CHOL/HDL Ratio: 4
Triglycerides: 94 mg/dL (ref 0.0–149.0)
VLDL: 18.8 mg/dL (ref 0.0–40.0)

## 2016-01-18 LAB — MICROALBUMIN / CREATININE URINE RATIO
CREATININE, U: 113.8 mg/dL
MICROALB UR: 3.3 mg/dL — AB (ref 0.0–1.9)
Microalb Creat Ratio: 2.9 mg/g (ref 0.0–30.0)

## 2016-01-18 LAB — COMPREHENSIVE METABOLIC PANEL
ALBUMIN: 4.3 g/dL (ref 3.5–5.2)
ALK PHOS: 68 U/L (ref 39–117)
ALT: 86 U/L — ABNORMAL HIGH (ref 0–53)
AST: 48 U/L — AB (ref 0–37)
BUN: 10 mg/dL (ref 6–23)
CALCIUM: 9.1 mg/dL (ref 8.4–10.5)
CHLORIDE: 103 meq/L (ref 96–112)
CO2: 26 mEq/L (ref 19–32)
CREATININE: 0.88 mg/dL (ref 0.40–1.50)
GFR: 98.6 mL/min (ref >60.00)
Glucose, Bld: 152 mg/dL — ABNORMAL HIGH (ref 70–99)
Potassium: 3.9 mEq/L (ref 3.5–5.1)
Sodium: 136 mEq/L (ref 135–145)
Total Bilirubin: 1.2 mg/dL (ref 0.2–1.2)
Total Protein: 7.1 g/dL (ref 6.0–8.3)

## 2016-01-18 LAB — HEMOGLOBIN A1C: Hgb A1c MFr Bld: 9.4 % — ABNORMAL HIGH (ref 4.6–6.5)

## 2016-01-25 ENCOUNTER — Encounter: Payer: Self-pay | Admitting: Family Medicine

## 2016-01-25 ENCOUNTER — Ambulatory Visit (INDEPENDENT_AMBULATORY_CARE_PROVIDER_SITE_OTHER): Payer: 59 | Admitting: Family Medicine

## 2016-01-25 VITALS — BP 148/90 | HR 85 | Temp 98.3°F | Wt 290.6 lb

## 2016-01-25 DIAGNOSIS — E785 Hyperlipidemia, unspecified: Secondary | ICD-10-CM | POA: Diagnosis not present

## 2016-01-25 DIAGNOSIS — Z794 Long term (current) use of insulin: Secondary | ICD-10-CM | POA: Diagnosis not present

## 2016-01-25 DIAGNOSIS — E118 Type 2 diabetes mellitus with unspecified complications: Secondary | ICD-10-CM

## 2016-01-25 DIAGNOSIS — I10 Essential (primary) hypertension: Secondary | ICD-10-CM

## 2016-01-25 MED ORDER — INSULIN GLARGINE 100 UNIT/ML SOLOSTAR PEN: PEN_INJECTOR | SUBCUTANEOUS | 3 refills | Status: DC

## 2016-01-25 MED ORDER — METFORMIN HCL 1000 MG PO TABS: 1000.0000 mg | ORAL_TABLET | Freq: Every day | ORAL | 3 refills | Status: DC

## 2016-01-25 MED ORDER — TADALAFIL 20 MG PO TABS: ORAL_TABLET | ORAL | 11 refills | Status: DC

## 2016-01-25 MED ORDER — SAXAGLIPTIN-METFORMIN ER 5-1000 MG PO TB24: 1.0000 | ORAL_TABLET | Freq: Every day | ORAL | 3 refills | Status: DC

## 2016-01-25 MED FILL — KOMBIGLYZE XR 5-1,000 MG TA: 5-1000 | 90 days supply | Qty: 90 | Fill #0

## 2016-01-25 MED FILL — metFORMIN HCL 1000 MG TABS: 1000 | 90 days supply | Qty: 90 | Fill #0

## 2016-01-25 MED FILL — CIALIS 20 MG TABLET: 20 | 30 days supply | Qty: 6 | Fill #0

## 2016-01-25 MED FILL — LANTUS SOLOSTAR 100 UNITS/M: 100 | 90 days supply | Qty: 36 | Fill #0

## 2016-01-25 NOTE — Assessment & Plan Note (Signed)
S: poorly (but improved) controlled. On kombiglyze 5-1000mg  with additional metformin 1000mg  at night and lantus 35 units. CBGs- 100-130 when at home, on road trips usually 150, only checks fasting Exercise and diet- trying to walk more when at airports and while at home usually walks in AM and that is what brings CBGs down Lab Results  Component Value Date   HGBA1C 9.4 (H) 01/18/2016   HGBA1C 9.8 (H) 11/24/2014   HGBA1C 7.9 (H) 08/21/2014   A/P: I strongly believed patient needed additional of amaryl or invokana/farxiga/jardiance. He is resistant to this and wants to work aggressively on weight loss- plan is follow up 3 months. His goal is about 260 so in 3 months hopefully halfway there as 290 today. We will slightly titrate lantus to 36 units.

## 2016-01-25 NOTE — Assessment & Plan Note (Signed)
S: poorly controlled on no meds considering diabetic and reasonable to push LDL <70. No myalgias.  Lab Results  Component Value Date   CHOL 139 01/18/2016   HDL 38.80 (L) 01/18/2016   LDLCALC 82 01/18/2016   LDLDIRECT 115.1 12/12/2013   TRIG 94.0 01/18/2016   CHOLHDL 4 01/18/2016   A/P: discussed statin but this is far down on his list- likely BP control and DM control first, then lipids but he is hopeful all improves with weight loss

## 2016-01-25 NOTE — Progress Notes (Signed)
Subjective:  Wayne Ho is a 45 y.o. year old very pleasant male patient who presents for/with See problem oriented charting ROS- No chest pain or shortness of breath. No headache or blurry vision.  see any ROS included in HPI as well.   Past Medical History-  Patient Active Problem List   Diagnosis Date Noted  . Type 2 diabetes mellitus with manifestations (HCC) 07/27/2009    Priority: High  . Essential hypertension, benign 12/27/2013    Priority: Medium  . Hyperlipidemia 12/27/2013    Priority: Medium  . Erectile dysfunction 10/17/2014    Priority: Low  . Obesity 02/18/2010    Priority: Low  . Obstructive sleep apnea 11/02/2007    Priority: Low  . GERD (gastroesophageal reflux disease) 12/19/2006    Priority: Low  . Rash and nonspecific skin eruption 10/17/2014    Medications- reviewed and updated Current Outpatient Prescriptions  Medication Sig Dispense Refill  . aspirin 81 MG tablet Take 81 mg by mouth daily.    Marland Kitchen. glucose blood (ACCU-CHEK AVIVA) test strip 1 each by Other route as needed. Use as instructed     . Insulin Glargine (LANTUS SOLOSTAR) 100 UNIT/ML Solostar Pen INJECT 35-40 UNITS INTO THE SKIN AT BEDTIME. 45 mL 3  . metFORMIN (GLUCOPHAGE) 1000 MG tablet Take 1 tablet (1,000 mg total) by mouth daily with supper. 90 tablet 3  . omeprazole (PRILOSEC) 20 MG capsule Take 20 mg by mouth daily.      . Saxagliptin-Metformin (KOMBIGLYZE XR) 09-998 MG TB24 Take 1 tablet by mouth daily. 90 tablet 3  . tadalafil (CIALIS) 20 MG tablet TAKE 0.5-1 TABLETS BY MOUTH EVERY OTHER DAY AS NEEDED FOR ERECTILE DYSFUNCTION. 10 tablet 11   No current facility-administered medications for this visit.     Objective: BP (!) 148/90 (BP Location: Left Arm, Patient Position: Sitting, Cuff Size: Large)   Pulse 85   Temp 98.3 F (36.8 C) (Oral)   Wt 290 lb 9.6 oz (131.8 kg)   SpO2 97%   BMI 36.32 kg/m  Gen: NAD, resting comfortably CV: RRR no murmurs rubs or gallops Lungs: CTAB no  crackles, wheeze, rhonchi Abdomen: soft/nontender/nondistended/normal bowel sounds. Large frame but still obese  Ext: no edema Skin: warm, dry Neuro: grossly normal, moves all extremities  Assessment/Plan:  Type 2 diabetes mellitus with manifestations S: poorly (but improved) controlled. On kombiglyze 5-1000mg  with additional metformin 1000mg  at night and lantus 35 units. CBGs- 100-130 when at home, on road trips usually 150, only checks fasting Exercise and diet- trying to walk more when at airports and while at home usually walks in AM and that is what brings CBGs down Lab Results  Component Value Date   HGBA1C 9.4 (H) 01/18/2016   HGBA1C 9.8 (H) 11/24/2014   HGBA1C 7.9 (H) 08/21/2014   A/P: I strongly believed patient needed additional of amaryl or invokana/farxiga/jardiance. He is resistant to this and wants to work aggressively on weight loss- plan is follow up 3 months. His goal is about 260 so in 3 months hopefully halfway there as 290 today. We will slightly titrate lantus to 36 units.   Hyperlipidemia S: poorly controlled on no meds considering diabetic and reasonable to push LDL <70. No myalgias.  Lab Results  Component Value Date   CHOL 139 01/18/2016   HDL 38.80 (L) 01/18/2016   LDLCALC 82 01/18/2016   LDLDIRECT 115.1 12/12/2013   TRIG 94.0 01/18/2016   CHOLHDL 4 01/18/2016   A/P: discussed statin but this is  far down on his list- likely BP control and DM control first, then lipids but he is hopeful all improves with weight loss   Essential hypertension, benign S: poorly controlled on no medicine BP Readings from Last 3 Encounters:  01/25/16 (!) 148/90  01/19/15 132/88  10/17/14 (!) 144/88  A/P: given trial patient has been given for diet/exercise discussed thought BP meds likely best at this point but he declines for now and will work on weight. Recheck 3 months  3 months  Orders Placed This Encounter  Procedures  . Hemoglobin A1c    [place]    Standing  Status:   Future    Standing Expiration Date:   01/24/2017  . Comprehensive metabolic panel    [place]    Standing Status:   Future    Standing Expiration Date:   01/24/2017    Meds ordered this encounter  Medications  . tadalafil (CIALIS) 20 MG tablet    Sig: TAKE 0.5-1 TABLETS BY MOUTH EVERY OTHER DAY AS NEEDED FOR ERECTILE DYSFUNCTION.    Dispense:  10 tablet    Refill:  11  . Saxagliptin-Metformin (KOMBIGLYZE XR) 09-998 MG TB24    Sig: Take 1 tablet by mouth daily.    Dispense:  90 tablet    Refill:  3  . Insulin Glargine (LANTUS SOLOSTAR) 100 UNIT/ML Solostar Pen    Sig: INJECT 35-40 UNITS INTO THE SKIN AT BEDTIME.    Dispense:  45 mL    Refill:  3  . metFORMIN (GLUCOPHAGE) 1000 MG tablet    Sig: Take 1 tablet (1,000 mg total) by mouth daily with supper.    Dispense:  90 tablet    Refill:  3    Return precautions advised.  Tana Conch, MD

## 2016-01-25 NOTE — Patient Instructions (Addendum)
I am concerned about blood pressure being above goal 140/90 (one or the other is elevated each time I see you) as well as diabetes control with a1c of 9.4 and goal less than 7. Cholesterol also slightly high. Liver function tests slightly off  You set a goal to work towards weight of 260 over next months (reasonable goal at next visit would be 275 (about a lb a week).   If blood pressure remains high would strongly consider lisinopril or amlodipine  If diabetes remains high would consider amaryl/glimepiride OR invokana, farxiga, jardiance.   I do want you to bump your lantus to 36 units as long as no fasting sugars below 180  Schedule lab visit for 04/19/16 or later and see me a few days later  Health Maintenance Due  Topic Date Due  . OPHTHALMOLOGY EXAM  01/21/2014  . INFLUENZA VACCINE  12/08/2015  . FOOT EXAM  01/19/2016  flu shot at work, eye exam has done- sign roi. Foot exam next visit

## 2016-01-25 NOTE — Progress Notes (Signed)
Pre visit review using our clinic review tool, if applicable. No additional management support is needed unless otherwise documented below in the visit note. 

## 2016-01-25 NOTE — Assessment & Plan Note (Signed)
S: poorly controlled on no medicine BP Readings from Last 3 Encounters:  01/25/16 (!) 148/90  01/19/15 132/88  10/17/14 (!) 144/88  A/P: given trial patient has been given for diet/exercise discussed thought BP meds likely best at this point but he declines for now and will work on weight. Recheck 3 months

## 2016-04-19 ENCOUNTER — Other Ambulatory Visit: Payer: 59

## 2016-04-21 ENCOUNTER — Other Ambulatory Visit: Payer: 59

## 2016-04-25 ENCOUNTER — Ambulatory Visit: Payer: 59 | Admitting: Family Medicine

## 2016-05-18 MED FILL — KOMBIGLYZE XR 5-1,000 MG TA: 5-1000 | 90 days supply | Qty: 90 | Fill #1

## 2016-06-06 ENCOUNTER — Other Ambulatory Visit (INDEPENDENT_AMBULATORY_CARE_PROVIDER_SITE_OTHER): Payer: 59

## 2016-06-06 DIAGNOSIS — E118 Type 2 diabetes mellitus with unspecified complications: Secondary | ICD-10-CM | POA: Diagnosis not present

## 2016-06-06 DIAGNOSIS — Z794 Long term (current) use of insulin: Secondary | ICD-10-CM

## 2016-06-06 LAB — HEMOGLOBIN A1C: Hgb A1c MFr Bld: 10.5 % — ABNORMAL HIGH (ref 4.6–6.5)

## 2016-06-06 LAB — COMPREHENSIVE METABOLIC PANEL
ALBUMIN: 4.4 g/dL (ref 3.5–5.2)
ALK PHOS: 79 U/L (ref 39–117)
ALT: 94 U/L — ABNORMAL HIGH (ref 0–53)
AST: 51 U/L — AB (ref 0–37)
BUN: 12 mg/dL (ref 6–23)
CO2: 25 mEq/L (ref 19–32)
CREATININE: 0.86 mg/dL (ref 0.40–1.50)
Calcium: 9.6 mg/dL (ref 8.4–10.5)
Chloride: 97 mEq/L (ref 96–112)
GFR: 101.08 mL/min (ref >60.00)
Glucose, Bld: 362 mg/dL — ABNORMAL HIGH (ref 70–99)
POTASSIUM: 4 meq/L (ref 3.5–5.1)
SODIUM: 133 meq/L — AB (ref 135–145)
TOTAL PROTEIN: 7.2 g/dL (ref 6.0–8.3)
Total Bilirubin: 1.1 mg/dL (ref 0.2–1.2)

## 2016-06-13 ENCOUNTER — Ambulatory Visit: Payer: 59 | Admitting: Family Medicine

## 2016-06-21 MED FILL — metFORMIN HCL 1000 MG TABS: 1000 | 90 days supply | Qty: 90 | Fill #1

## 2016-06-21 MED FILL — LANTUS SOLOSTAR 100 UNITS/M: 100 | 90 days supply | Qty: 36 | Fill #1

## 2016-07-04 ENCOUNTER — Ambulatory Visit: Payer: 59 | Admitting: Family Medicine

## 2016-07-11 ENCOUNTER — Encounter: Payer: Self-pay | Admitting: Family Medicine

## 2016-07-11 ENCOUNTER — Ambulatory Visit (INDEPENDENT_AMBULATORY_CARE_PROVIDER_SITE_OTHER): Payer: 59 | Admitting: Family Medicine

## 2016-07-11 VITALS — BP 142/92 | HR 99 | Temp 98.4°F | Ht 75.0 in | Wt 289.8 lb

## 2016-07-11 DIAGNOSIS — Z794 Long term (current) use of insulin: Secondary | ICD-10-CM | POA: Diagnosis not present

## 2016-07-11 DIAGNOSIS — I10 Essential (primary) hypertension: Secondary | ICD-10-CM

## 2016-07-11 DIAGNOSIS — E785 Hyperlipidemia, unspecified: Secondary | ICD-10-CM

## 2016-07-11 DIAGNOSIS — E118 Type 2 diabetes mellitus with unspecified complications: Secondary | ICD-10-CM | POA: Diagnosis not present

## 2016-07-11 MED ORDER — CANAGLIFLOZIN 100 MG PO TABS: 100.0000 mg | ORAL_TABLET | Freq: Every day | ORAL | 3 refills | Status: DC

## 2016-07-11 NOTE — Assessment & Plan Note (Signed)
S: reasonably controlled on no medicine though LDL 82 and could push for under 70. No myalgias.   A/P: weight loss would be key, discussed statin option. He prefers another weight loss trial.

## 2016-07-11 NOTE — Progress Notes (Signed)
Subjective:  Wayne Ho is a 46 y.o. year old very pleasant male patient who presents for/with See problem oriented charting ROS- No chest pain or shortness of breath. No headache or blurry vision. Denies hypoglycemia   Past Medical History-  Patient Active Problem List   Diagnosis Date Noted  . Type 2 diabetes mellitus with manifestations (HCC) 07/27/2009    Priority: High  . Essential hypertension, benign 12/27/2013    Priority: Medium  . Hyperlipidemia 12/27/2013    Priority: Medium  . Erectile dysfunction 10/17/2014    Priority: Low  . Obesity 02/18/2010    Priority: Low  . Obstructive sleep apnea 11/02/2007    Priority: Low  . GERD (gastroesophageal reflux disease) 12/19/2006    Priority: Low  . Rash and nonspecific skin eruption 10/17/2014    Medications- reviewed and updated Current Outpatient Prescriptions  Medication Sig Dispense Refill  . aspirin 81 MG tablet Take 81 mg by mouth daily.    Marland Kitchen glucose blood (ACCU-CHEK AVIVA) test strip 1 each by Other route as needed. Use as instructed     . Insulin Glargine (LANTUS SOLOSTAR) 100 UNIT/ML Solostar Pen INJECT 35-40 UNITS INTO THE SKIN AT BEDTIME. 45 mL 3  . metFORMIN (GLUCOPHAGE) 1000 MG tablet Take 1 tablet (1,000 mg total) by mouth daily with supper. 90 tablet 3  . omeprazole (PRILOSEC) 20 MG capsule Take 20 mg by mouth daily.      . Saxagliptin-Metformin (KOMBIGLYZE XR) 09-998 MG TB24 Take 1 tablet by mouth daily. 90 tablet 3  . tadalafil (CIALIS) 20 MG tablet TAKE 0.5-1 TABLETS BY MOUTH EVERY OTHER DAY AS NEEDED FOR ERECTILE DYSFUNCTION. 10 tablet 11  . canagliflozin (INVOKANA) 100 MG TABS tablet Take 1 tablet (100 mg total) by mouth daily before breakfast. 30 tablet 3   No current facility-administered medications for this visit.     Objective: BP (!) 142/92   Pulse 99   Temp 98.4 F (36.9 C) (Oral)   Ht 6\' 3"  (1.905 m)   Wt 289 lb 12.8 oz (131.5 kg)   SpO2 97%   BMI 36.22 kg/m  Gen: NAD, resting  comfortably CV: RRR no murmurs rubs or gallops Lungs: CTAB no crackles, wheeze, rhonchi Muscular but also overweight Ext: no edema Skin: warm, dry  Assessment/Plan:  Type 2 diabetes mellitus with manifestations S: very poorly and worsening controlled. On kombiglyze 5-1000mg  with additional 1000mg  at night. Also on lantus 35 units. I had advised amaryl or sglt2 inhibitor- he declined nd wanted to work aggressively onweight loss with goal of 260 in 3 months- he remains at 290 and waited 6 months instead of advised 3 for follow up.   In [place] 180 fasting. In January averaged 110 when working from home and able to get to gym. Has seen #s in 300s.  Lab Results  Component Value Date   HGBA1C 10.5 (H) 06/06/2016   HGBA1C 9.4 (H) 01/18/2016   HGBA1C 9.8 (H) 11/24/2014   A/P: patient agrees to trial addition of invokana 100mg  (other options did not appear on formulary for sglt2 inhibitor) and he wants to buckle down on lifestyle changes. Hopefully with this he can drop weight helping lipids and BP. 3 month follow up. I am thinking we are likely rapidly approaching mealtime insulin or endocrine referral unless he can make a significant change in lifestyle which will be challenging given work travels.   Essential hypertension, benign S: poorly controlled on no medicine last time. Advised meds and he declined last  time BP Readings from Last 3 Encounters:  07/11/16 (!) 142/92  01/25/16 (!) 148/90  01/19/15 132/88  A/P:Continue without meds- his hope is that with the invokana and weight loss this can be controlled by follow up in 3 months  Hyperlipidemia S: reasonably controlled on no medicine though LDL 82 and could push for under 70. No myalgias.   A/P: weight loss would be key, discussed statin option. He prefers another weight loss trial.   Meds ordered this encounter  Medications  . canagliflozin (INVOKANA) 100 MG TABS tablet    Sig: Take 1 tablet (100 mg total) by mouth daily before  breakfast.    Dispense:  30 tablet    Refill:  3   Return precautions advised.  Tana ConchStephen Ashland Osmer, MD

## 2016-07-11 NOTE — Progress Notes (Signed)
Pre visit review using our clinic review tool, if applicable. No additional management support is needed unless otherwise documented below in the visit note. 

## 2016-07-11 NOTE — Assessment & Plan Note (Addendum)
S: very poorly and worsening controlled. On kombiglyze 5-1000mg  with additional 1000mg  at night. Also on lantus 35 units. I had advised amaryl or sglt2 inhibitor- he declined nd wanted to work aggressively onweight loss with goal of 260 in 3 months- he remains at 290 and waited 6 months instead of advised 3 for follow up.   In [place] 180 fasting. In January averaged 110 when working from home and able to get to gym. Has seen #s in 300s.  Lab Results  Component Value Date   HGBA1C 10.5 (H) 06/06/2016   HGBA1C 9.4 (H) 01/18/2016   HGBA1C 9.8 (H) 11/24/2014   A/P: patient agrees to trial addition of invokana 100mg  (other options did not appear on formulary for sglt2 inhibitor) and he wants to buckle down on lifestyle changes. Hopefully with this he can drop weight helping lipids and BP. 3 month follow up. I am thinking we are likely rapidly approaching mealtime insulin or endocrine referral unless he can make a significant change in lifestyle which will be challenging given work travels.

## 2016-07-11 NOTE — Assessment & Plan Note (Signed)
S: poorly controlled on no medicine last time. Advised meds and he declined last time BP Readings from Last 3 Encounters:  07/11/16 (!) 142/92  01/25/16 (!) 148/90  01/19/15 132/88  A/P:Continue without meds- his hope is that with the invokana and weight loss this can be controlled by follow up in 3 months

## 2016-07-11 NOTE — Patient Instructions (Addendum)
Was going to use farxiga but appears insurance prefers invokana  Sent this in at 100mg . I do not have a coupon in office for this- you can usually check out the website for invokana and find similar  Lets still focus on weight loss with goal of at least 10 lbs off in 3 months.   Make sure to stay well hydrated. See me for any sugars under 70.   I would strongly prefer to see you at least every 14 weeks at least until a1c back under 7. See me in 3 months.

## 2016-07-21 MED FILL — INVOKANA 100 MG TABLET: 100 | 30 days supply | Qty: 30 | Fill #0

## 2016-09-01 MED FILL — KOMBIGLYZE XR 5-1,000 MG TA: 5-1000 | 90 days supply | Qty: 90 | Fill #2

## 2016-10-14 ENCOUNTER — Telehealth: Payer: Self-pay

## 2016-10-14 NOTE — Telephone Encounter (Signed)
Insurance wants patient to trial Viagra before they will cover Cialis.

## 2016-10-17 ENCOUNTER — Other Ambulatory Visit: Payer: Self-pay

## 2016-10-17 MED ORDER — SILDENAFIL CITRATE 100 MG PO TABS: 50.0000 mg | ORAL_TABLET | Freq: Every day | ORAL | 11 refills | Status: AC | PRN

## 2016-10-17 MED FILL — SILDENAFIL 100 MG TABLET: 100 | 30 days supply | Qty: 6 | Fill #0

## 2016-10-17 NOTE — Telephone Encounter (Signed)
Please inform patient. May change to viagra 100mg  #10 with 11 refills if he would like. Advise him to trial half pill to start and check [email or web address].com for coupon

## 2016-10-17 NOTE — Telephone Encounter (Signed)
Called patient and spoke to him. He verbalized understanding and I sent in the prescription as requested

## 2016-12-21 MED FILL — KOMBIGLYZE XR 5-1,000 MG TA: 5-1000 | 90 days supply | Qty: 90 | Fill #3

## 2017-01-23 MED FILL — CIALIS 20 MG TABLET: 20 | 30 days supply | Qty: 6 | Fill #1

## 2017-01-23 MED FILL — SILDENAFIL CITRATE 100 MG T: 100 | 30 days supply | Qty: 6 | Fill #1

## 2017-01-27 DIAGNOSIS — H524 Presbyopia: Secondary | ICD-10-CM | POA: Diagnosis not present

## 2017-01-27 DIAGNOSIS — H5213 Myopia, bilateral: Secondary | ICD-10-CM | POA: Diagnosis not present

## 2017-01-27 DIAGNOSIS — H52223 Regular astigmatism, bilateral: Secondary | ICD-10-CM | POA: Diagnosis not present

## 2017-01-27 LAB — HM DIABETES EYE EXAM

## 2017-02-02 ENCOUNTER — Encounter: Payer: Self-pay | Admitting: Family Medicine

## 2017-04-10 ENCOUNTER — Other Ambulatory Visit: Payer: Self-pay | Admitting: Family Medicine

## 2017-04-12 ENCOUNTER — Telehealth: Payer: Self-pay | Admitting: Family Medicine

## 2017-04-12 MED FILL — KOMBIGLYZE XR 5-1,000 MG TA: 5-1000 | 90 days supply | Qty: 90 | Fill #0

## 2017-04-12 NOTE — Telephone Encounter (Signed)
Called and spoke to patient. He states he has a yeast infection. He wanted to know if something can be called in. He states it is in the creases of both legs.

## 2017-04-12 NOTE — Telephone Encounter (Signed)
Copied from CRM [phone]. Topic: General - Other >> Apr 12, 2017 11:55 AM Oneal GroutSebastian, Jennifer S wrote: Reason for CRM: Patient requesting to speak to Dr Durene CalHunter, would not tell me why

## 2017-04-12 NOTE — Telephone Encounter (Signed)
Actually needs visit anyway.  He was seen in March and advised 5247-month follow-up

## 2017-04-12 NOTE — Telephone Encounter (Signed)
If he is concerned about jock itch- lotrimin over the counter is effective generally- would use it a week past when rash is gone. If not improving- come to see us

## 2017-04-13 ENCOUNTER — Telehealth: Payer: Self-pay | Admitting: Family Medicine

## 2017-04-13 NOTE — Telephone Encounter (Signed)
Pt called back and said [place] and can not come in the office and would like Asher MuirJamie to call him back

## 2017-04-13 NOTE — Telephone Encounter (Signed)
Copied from CRM [phone]. Topic: Inquiry >> Apr 13, 2017 11:38 AM Alexander BergeronBarksdale, Harvey B wrote: Reason for CRM: pt called to ask about a Rx of which he doesn't know the name, its not the Rx that was filled yesterday, contact pt to advise

## 2017-04-13 NOTE — Telephone Encounter (Signed)
See separate thread amber started- I have asked her to keep it in one thread in future  "If he is concerned about jock itch- lotrimin over the counter is effective generally- would use it a week past when rash is gone. If not improving- come to see us"  Some additional info in other note

## 2017-04-13 NOTE — Telephone Encounter (Signed)
Called patient and left a voicemail message asking patient to call back and schedule an appointment

## 2017-04-13 NOTE — Telephone Encounter (Signed)
Copied from CRM [id number]. Topic: Quick Communication - Office Called Patient >> Apr 13, 2017  1:22 PM Southern Red BankHizer, WarrentonJamie M, [place] wrote: Reason for CRM: Called patient and left a voicemail message requesting patient call office and schedule an appointment to see Dr. Durene CalHunter for acute issue (yeast) and he is over due for a follow up visit. >> Apr 13, 2017  3:22 PM Alexander BergeronBarksdale, Harvey B wrote: Pt called to return the phone call but he stated he needed the yes or no on the antibiotic, he is in [place] and cant come in to the practice, pt was informed that usually if an antibiotic is wanted that he would usually need to come in for a visit, but it was also stated potentially if a visit is needed that he may be asked to walk-in somewhere in [place] so an antibiotic could be prescribed but he stated that if he does that-that he's not coming back to the practice

## 2017-04-13 NOTE — Telephone Encounter (Signed)
Amber- please add phone calls into prior phone calls on topic instead of creating new thread if possible. I didn't realize he was out of state when I wrote the second portion about being seen now. I do want him to schedule a visit when he is back in state.   From my note "If he is concerned about jock itch- lotrimin over the counter is effective generally- would use it a week past when rash is gone. If not improving- come to see us"  This was about yeast infection not antibiotic to my knowledge. There is a daily treatment called ketoconazole that is prescription but lotrimin should workjust as well.

## 2017-04-14 MED ORDER — KETOCONAZOLE 2 % EX CREA: 1.0000 "application " | TOPICAL_CREAM | Freq: Every day | CUTANEOUS | 0 refills | Status: DC

## 2017-04-14 NOTE — Telephone Encounter (Signed)
Spoke with patient.   He asked me to call in what Dr. Lovell SheehanJenkins sent in years ago- this is not readily available data though - was pre epic.   Called in ketoconazole. Dime sized rash in groin now about 2 inches big. Similar to rash years ago. Has tried over the counters- not clear how consistent. He is higher risk given SGLT2 inhibitor  Told him to try ketoconazole once daily for next 3-4 weeks which I called in. If fails to improve, needs to be seen here or if worsens. I would much prefer in person visit before prescribing oral medicine to have time to go over benefits/risks and options.   He was frustrated with the back and forth by the phone process. He did not restate any threat about leaving the practice- I will disregard that for now but if similar comments resurface in future would want this directly addressed as not acceptable patient behavior.   He needs diabetes follow up- I advised him to schedule this within 2 weeks so we can also check in on rash.

## 2017-04-14 NOTE — Telephone Encounter (Signed)
[facility] and I was able to speak directly to him.  He did not mention an antibiotic.  He said that he has a yeast infection.  He said that he has tried all over the counter creams, including Lotrimin and he is "back at square one".  He said he had the very same think several years ago and Dr. Lovell SheehanJenkins prescribed him an oral medication that the took "for a month" and everything cleared up.  He asks that a medication be called in to [facility].  He is flying home tonight from [place] and his wife will be able to pick up the prescription for him.  He would appreciate a call letting him know that medication is being called in.

## 2017-07-10 ENCOUNTER — Ambulatory Visit (INDEPENDENT_AMBULATORY_CARE_PROVIDER_SITE_OTHER): Payer: 59 | Admitting: Otolaryngology

## 2017-07-19 MED FILL — KOMBIGLYZE XR 5-1,000 MG TA: 5-1000 | 90 days supply | Qty: 90 | Fill #1

## 2017-07-24 DIAGNOSIS — Z1389 Encounter for screening for other disorder: Secondary | ICD-10-CM | POA: Diagnosis not present

## 2017-07-24 DIAGNOSIS — Z7984 Long term (current) use of oral hypoglycemic drugs: Secondary | ICD-10-CM | POA: Diagnosis not present

## 2017-07-24 DIAGNOSIS — R030 Elevated blood-pressure reading, without diagnosis of hypertension: Secondary | ICD-10-CM | POA: Diagnosis not present

## 2017-07-24 DIAGNOSIS — E1165 Type 2 diabetes mellitus with hyperglycemia: Secondary | ICD-10-CM | POA: Diagnosis not present

## 2017-07-25 MED FILL — metFORMIN HCL 1000 MG TABS: 1000 | 30 days supply | Qty: 60 | Fill #0

## 2017-08-01 MED FILL — TRULICITY 1.5 MG/0.5 ML PEN: 1.5 | 28 days supply | Qty: 2 | Fill #0

## 2017-08-08 ENCOUNTER — Ambulatory Visit: Payer: 59 | Admitting: Dietician

## 2017-08-28 DIAGNOSIS — K219 Gastro-esophageal reflux disease without esophagitis: Secondary | ICD-10-CM | POA: Diagnosis not present

## 2017-08-28 DIAGNOSIS — E1165 Type 2 diabetes mellitus with hyperglycemia: Secondary | ICD-10-CM | POA: Diagnosis not present

## 2017-08-28 DIAGNOSIS — Z0000 Encounter for general adult medical examination without abnormal findings: Secondary | ICD-10-CM | POA: Diagnosis not present

## 2017-08-28 DIAGNOSIS — Z125 Encounter for screening for malignant neoplasm of prostate: Secondary | ICD-10-CM | POA: Diagnosis not present

## 2017-09-01 MED FILL — TRULICITY 1.5 MG/0.5 ML PEN: 1.5 | 28 days supply | Qty: 2 | Fill #1

## 2017-10-03 MED FILL — TRULICITY 1.5 MG/0.5 ML PEN: 1.5 | 28 days supply | Qty: 2 | Fill #2

## 2017-11-15 MED FILL — TRULICITY 1.5 MG/0.5 ML PEN: 1.5 | 28 days supply | Qty: 2 | Fill #3

## 2017-12-12 MED FILL — metFORMIN HCL 1000 MG TABS: 1000 | 30 days supply | Qty: 60 | Fill #1

## 2017-12-12 MED FILL — TRULICITY 1.5 MG/0.5 ML PEN: 1.5 | 28 days supply | Qty: 2 | Fill #4

## 2018-01-02 DIAGNOSIS — K219 Gastro-esophageal reflux disease without esophagitis: Secondary | ICD-10-CM | POA: Diagnosis not present

## 2018-01-02 DIAGNOSIS — I10 Essential (primary) hypertension: Secondary | ICD-10-CM | POA: Diagnosis not present

## 2018-01-02 DIAGNOSIS — E1165 Type 2 diabetes mellitus with hyperglycemia: Secondary | ICD-10-CM | POA: Diagnosis not present

## 2018-01-11 MED FILL — TRULICITY 1.5 MG/0.5 ML PEN: 1.5 | 28 days supply | Qty: 2 | Fill #5

## 2018-02-27 MED FILL — TRULICITY 1.5 MG/0.5 ML PEN: 1.5 | 28 days supply | Qty: 2 | Fill #6

## 2018-03-28 MED FILL — TRULICITY 1.5 MG/0.5 ML PEN: 1.5 | 28 days supply | Qty: 2 | Fill #0

## 2018-03-28 MED FILL — metFORMIN HCL 1000 MG TABS: 1000 | 30 days supply | Qty: 60 | Fill #2

## 2018-05-04 DIAGNOSIS — M79604 Pain in right leg: Secondary | ICD-10-CM | POA: Diagnosis not present

## 2018-05-04 DIAGNOSIS — E1165 Type 2 diabetes mellitus with hyperglycemia: Secondary | ICD-10-CM | POA: Diagnosis not present

## 2018-05-04 DIAGNOSIS — Z7984 Long term (current) use of oral hypoglycemic drugs: Secondary | ICD-10-CM | POA: Diagnosis not present

## 2018-05-04 DIAGNOSIS — Z6836 Body mass index (BMI) 36.0-36.9, adult: Secondary | ICD-10-CM | POA: Diagnosis not present

## 2018-05-04 DIAGNOSIS — I10 Essential (primary) hypertension: Secondary | ICD-10-CM | POA: Diagnosis not present

## 2018-06-18 MED FILL — TRULICITY 1.5 MG/0.5 ML PEN: 1.5 | 28 days supply | Qty: 2 | Fill #1

## 2018-06-18 MED FILL — metFORMIN HCL 1000 MG TABS: 1000 | 30 days supply | Qty: 60 | Fill #3

## 2018-07-30 MED FILL — TRULICITY 1.5 MG/0.5 ML PEN: 1.5 | 28 days supply | Qty: 2 | Fill #0

## 2018-09-03 MED FILL — TRULICITY 1.5 MG/0.5 ML PEN: 1.5 | 28 days supply | Qty: 2 | Fill #1

## 2018-09-04 ENCOUNTER — Telehealth: Payer: Self-pay

## 2018-09-04 NOTE — Telephone Encounter (Signed)
Please update PCP

## 2018-09-04 NOTE — Telephone Encounter (Signed)
Left message for pt to return phone call

## 2018-09-04 NOTE — Telephone Encounter (Signed)
Pt called and stated he has moved to [facility] with Dr. Eula Listen. Please advise

## 2018-09-04 NOTE — Telephone Encounter (Signed)
-----   Message from Shelva Majestic, MD sent at 09/03/2018  9:07 PM EDT ----- Patient was last seen 2 years ago for diabetes. Please call to see if he has moved or transferred to another physician- if not, really want to see him for video visit and restart care for him.   Tana Conch

## 2018-09-05 DIAGNOSIS — I10 Essential (primary) hypertension: Secondary | ICD-10-CM | POA: Diagnosis not present

## 2018-09-05 DIAGNOSIS — Z7984 Long term (current) use of oral hypoglycemic drugs: Secondary | ICD-10-CM | POA: Diagnosis not present

## 2018-09-05 DIAGNOSIS — E1165 Type 2 diabetes mellitus with hyperglycemia: Secondary | ICD-10-CM | POA: Diagnosis not present

## 2018-09-05 MED FILL — metFORMIN HCL 1000 MG TABS: 1000 | 90 days supply | Qty: 180 | Fill #0

## 2018-11-20 MED FILL — TRULICITY 1.5 MG/0.5 ML PEN: 1.5 | 28 days supply | Qty: 2 | Fill #0

## 2019-02-07 MED FILL — TRULICITY 1.5 MG/0.5 ML PEN: 1.5 | 28 days supply | Qty: 2 | Fill #1

## 2019-02-19 DIAGNOSIS — I10 Essential (primary) hypertension: Secondary | ICD-10-CM | POA: Diagnosis not present

## 2019-02-19 DIAGNOSIS — E1165 Type 2 diabetes mellitus with hyperglycemia: Secondary | ICD-10-CM | POA: Diagnosis not present

## 2019-02-22 DIAGNOSIS — I10 Essential (primary) hypertension: Secondary | ICD-10-CM | POA: Diagnosis not present

## 2019-02-22 DIAGNOSIS — E1165 Type 2 diabetes mellitus with hyperglycemia: Secondary | ICD-10-CM | POA: Diagnosis not present

## 2019-03-01 ENCOUNTER — Other Ambulatory Visit: Payer: Self-pay

## 2019-03-01 DIAGNOSIS — Z20822 Contact with and (suspected) exposure to covid-19: Secondary | ICD-10-CM

## 2019-03-02 LAB — NOVEL CORONAVIRUS, NAA: SARS-CoV-2, NAA: NOT DETECTED

## 2019-04-30 MED FILL — TRULICITY 1.5 MG/0.5 ML PEN: 1.5 | 84 days supply | Qty: 6 | Fill #2

## 2019-05-24 DIAGNOSIS — E1165 Type 2 diabetes mellitus with hyperglycemia: Secondary | ICD-10-CM | POA: Diagnosis not present

## 2019-05-24 DIAGNOSIS — Z13220 Encounter for screening for lipoid disorders: Secondary | ICD-10-CM | POA: Diagnosis not present

## 2019-05-24 DIAGNOSIS — Z0000 Encounter for general adult medical examination without abnormal findings: Secondary | ICD-10-CM | POA: Diagnosis not present

## 2019-05-28 DIAGNOSIS — L03031 Cellulitis of right toe: Secondary | ICD-10-CM | POA: Diagnosis not present

## 2019-05-28 DIAGNOSIS — E1165 Type 2 diabetes mellitus with hyperglycemia: Secondary | ICD-10-CM | POA: Diagnosis not present

## 2019-05-28 MED FILL — CEPHALEXIN 500 MG CAPSULE: 500 | 7 days supply | Qty: 14 | Fill #0

## 2019-05-30 ENCOUNTER — Other Ambulatory Visit: Payer: Self-pay | Admitting: Internal Medicine

## 2019-05-30 DIAGNOSIS — Z0000 Encounter for general adult medical examination without abnormal findings: Secondary | ICD-10-CM | POA: Diagnosis not present

## 2019-05-30 DIAGNOSIS — E785 Hyperlipidemia, unspecified: Secondary | ICD-10-CM | POA: Diagnosis not present

## 2019-05-30 DIAGNOSIS — I10 Essential (primary) hypertension: Secondary | ICD-10-CM | POA: Diagnosis not present

## 2019-05-30 DIAGNOSIS — E1140 Type 2 diabetes mellitus with diabetic neuropathy, unspecified: Secondary | ICD-10-CM | POA: Diagnosis not present

## 2019-05-30 DIAGNOSIS — E1165 Type 2 diabetes mellitus with hyperglycemia: Secondary | ICD-10-CM | POA: Diagnosis not present

## 2019-05-30 DIAGNOSIS — Z1389 Encounter for screening for other disorder: Secondary | ICD-10-CM | POA: Diagnosis not present

## 2019-05-30 DIAGNOSIS — Z136 Encounter for screening for cardiovascular disorders: Secondary | ICD-10-CM | POA: Diagnosis not present

## 2019-05-30 MED FILL — metFORMIN HCL 1000 MG TABS: 1000 | 90 days supply | Qty: 180 | Fill #0

## 2019-05-30 MED FILL — SIMVASTATIN 5 MG TABS: 5 | 90 days supply | Qty: 90 | Fill #0

## 2019-05-30 MED FILL — LISINOPRIL 5 MG TABS: 5 | 90 days supply | Qty: 90 | Fill #0

## 2019-05-30 MED FILL — metFORMIN HCL 1000 MG TABS: 1000 | 90 days supply | Qty: 180 | Fill #0 | Status: TO

## 2019-05-30 MED FILL — LISINOPRIL 5 MG TABS: 5 | 90 days supply | Qty: 90 | Fill #0 | Status: TO

## 2019-05-30 MED FILL — SIMVASTATIN 5 MG TABLET: 5 | 90 days supply | Qty: 90 | Fill #0 | Status: TO

## 2019-06-06 ENCOUNTER — Ambulatory Visit
Admission: RE | Admit: 2019-06-06 | Discharge: 2019-06-06 | Disposition: A | Discharge status: Home / Self Care | Payer: No Typology Code available for payment source | Source: Ambulatory Visit | Attending: Internal Medicine | Admitting: Internal Medicine

## 2019-06-06 DIAGNOSIS — Z136 Encounter for screening for cardiovascular disorders: Secondary | ICD-10-CM

## 2019-06-07 ENCOUNTER — Other Ambulatory Visit: Payer: Self-pay | Admitting: Internal Medicine

## 2019-06-07 ENCOUNTER — Ambulatory Visit: Payer: 59 | Attending: Internal Medicine

## 2019-06-07 DIAGNOSIS — Z20822 Contact with and (suspected) exposure to covid-19: Secondary | ICD-10-CM | POA: Diagnosis not present

## 2019-06-07 DIAGNOSIS — Z136 Encounter for screening for cardiovascular disorders: Secondary | ICD-10-CM

## 2019-06-08 LAB — NOVEL CORONAVIRUS, NAA: SARS-CoV-2, NAA: DETECTED — AB

## 2019-06-09 ENCOUNTER — Telehealth: Payer: Self-pay | Admitting: Physician Assistant

## 2019-06-09 NOTE — Telephone Encounter (Signed)
Called to discuss with Wayne Ho about Covid symptoms and the use of bamlanivimab or casirivimab/imdevimab, a monoclonal antibody infusion for those with mild to moderate Covid symptoms and at a high risk of hospitalization.     Pt is qualified for this infusion at the [facility] infusion center due to co-morbid conditions and/or a member of an at-risk group; however, Pt does not qualify for infusion therapy as pt has asymptomatic infection. Isolation precautions discussed. Advised to contact back for consideration should they develop symptoms. Patient verbalized understanding.      Patient Active Problem List   Diagnosis Date Noted  . Erectile dysfunction 10/17/2014  . Rash and nonspecific skin eruption 10/17/2014  . Essential hypertension, benign 12/27/2013  . Hyperlipidemia 12/27/2013  . Obesity 02/18/2010  . Type 2 diabetes mellitus with manifestations (HCC) 07/27/2009  . Obstructive sleep apnea 11/02/2007  . GERD (gastroesophageal reflux disease) 12/19/2006    Cline Crock PA-C

## 2019-06-10 ENCOUNTER — Other Ambulatory Visit: Payer: Self-pay | Admitting: Physician Assistant

## 2019-06-10 DIAGNOSIS — U071 COVID-19: Secondary | ICD-10-CM

## 2019-06-10 DIAGNOSIS — E119 Type 2 diabetes mellitus without complications: Secondary | ICD-10-CM

## 2019-06-10 NOTE — Progress Notes (Addendum)
  I connected by phone with Wayne Ho on 06/10/2019 at 6:10 PM to discuss the potential use of an new treatment for mild to moderate COVID-19 viral infection in non-hospitalized patients. He has now developed symptoms.  This patient is a 49 y.o. male that meets the FDA criteria for Emergency Use Authorization of bamlanivimab or casirivimab\imdevimab.  Has a (+) direct SARS-CoV-2 viral test result  Has mild or moderate COVID-19   Is ? 49 years of age and weighs ? 40 kg  Is NOT hospitalized due to COVID-19  Is NOT requiring oxygen therapy or requiring an increase in baseline oxygen flow rate due to COVID-19  Is within 10 days of symptom onset  Has at least one of the high risk factor(s) for progression to severe COVID-19 and/or hospitalization as defined in EUA.  Specific high risk criteria : Diabetes   I have spoken and communicated the following to the patient or parent/caregiver:  1. FDA has authorized the emergency use of bamlanivimab and casirivimab\imdevimab for the treatment of mild to moderate COVID-19 in adults and pediatric patients with positive results of direct SARS-CoV-2 viral testing who are 26 years of age and older weighing at least 40 kg, and who are at high risk for progressing to severe COVID-19 and/or hospitalization.  2. The significant known and potential risks and benefits of bamlanivimab and casirivimab\imdevimab, and the extent to which such potential risks and benefits are unknown.  3. Information on available alternative treatments and the risks and benefits of those alternatives, including clinical trials.  4. Patients treated with bamlanivimab and casirivimab\imdevimab should continue to self-isolate and use infection control measures (e.g., wear mask, isolate, social distance, avoid sharing personal items, clean and disinfect "high touch" surfaces, and frequent handwashing) according to CDC guidelines.   5. The patient or parent/caregiver has the option  to accept or refuse bamlanivimab or casirivimab\imdevimab .  After reviewing this information with the patient, The patient agreed to proceed with receiving the bamlanimivab infusion and will be provided a copy of the Fact sheet prior to receiving the infusion.    Set up for 06/11/19 @ 1430. Directions given.   Cline Crock 06/10/2019 6:10 PM

## 2019-06-11 ENCOUNTER — Ambulatory Visit (HOSPITAL_COMMUNITY)
Admission: RE | Admit: 2019-06-11 | Discharge: 2019-06-11 | Disposition: A | Discharge status: Home / Self Care | Payer: 59 | Source: Ambulatory Visit | Attending: Pulmonary Disease | Admitting: Pulmonary Disease

## 2019-06-11 DIAGNOSIS — Z23 Encounter for immunization: Secondary | ICD-10-CM | POA: Insufficient documentation

## 2019-06-11 DIAGNOSIS — E119 Type 2 diabetes mellitus without complications: Secondary | ICD-10-CM | POA: Insufficient documentation

## 2019-06-11 DIAGNOSIS — U071 COVID-19: Secondary | ICD-10-CM | POA: Insufficient documentation

## 2019-06-11 MED ORDER — SODIUM CHLORIDE 0.9 % IV SOLN: INTRAVENOUS | Status: DC | PRN

## 2019-06-11 MED ORDER — DIPHENHYDRAMINE HCL 50 MG/ML IJ SOLN: 50.0000 mg | Freq: Once | INTRAMUSCULAR | Status: DC | PRN

## 2019-06-11 MED ORDER — FAMOTIDINE IN NACL 20-0.9 MG/50ML-% IV SOLN: 20.0000 mg | Freq: Once | INTRAVENOUS | Status: DC | PRN

## 2019-06-11 MED ORDER — METHYLPREDNISOLONE SODIUM SUCC 125 MG IJ SOLR: 125.0000 mg | Freq: Once | INTRAMUSCULAR | Status: DC | PRN

## 2019-06-11 MED ORDER — EPINEPHRINE 0.3 MG/0.3ML IJ SOAJ: 0.3000 mg | Freq: Once | INTRAMUSCULAR | Status: DC | PRN

## 2019-06-11 MED ORDER — ALBUTEROL SULFATE HFA 108 (90 BASE) MCG/ACT IN AERS: 2.0000 | INHALATION_SPRAY | Freq: Once | RESPIRATORY_TRACT | Status: DC | PRN

## 2019-06-11 MED ORDER — SODIUM CHLORIDE 0.9 % IV SOLN
700.0000 mg | Freq: Once | INTRAVENOUS | Status: AC
  Administered 2019-06-11: 700 mg via INTRAVENOUS
  Filled 2019-06-11: qty 20

## 2019-06-11 NOTE — Discharge Instructions (Signed)

## 2019-06-11 NOTE — Progress Notes (Signed)
  Diagnosis: COVID-19  Physician: Dr. Wright  Procedure: Covid Infusion Clinic Med: bamlanivimab infusion - Provided patient with bamlanimivab fact sheet for patients, parents and caregivers prior to infusion.  Complications: No immediate complications noted.  Discharge: Discharged home   Wayne Ho 06/11/2019   

## 2019-06-17 ENCOUNTER — Other Ambulatory Visit: Payer: Self-pay | Admitting: Internal Medicine

## 2019-06-17 ENCOUNTER — Ambulatory Visit: Payer: 59 | Attending: Internal Medicine

## 2019-06-17 DIAGNOSIS — Z20822 Contact with and (suspected) exposure to covid-19: Secondary | ICD-10-CM | POA: Diagnosis not present

## 2019-06-17 DIAGNOSIS — R918 Other nonspecific abnormal finding of lung field: Secondary | ICD-10-CM

## 2019-06-18 LAB — NOVEL CORONAVIRUS, NAA: SARS-CoV-2, NAA: DETECTED — AB

## 2019-07-10 ENCOUNTER — Encounter: Payer: Self-pay | Admitting: Cardiovascular Disease

## 2019-07-10 ENCOUNTER — Other Ambulatory Visit: Payer: Self-pay

## 2019-07-10 ENCOUNTER — Ambulatory Visit (INDEPENDENT_AMBULATORY_CARE_PROVIDER_SITE_OTHER): Payer: 59 | Admitting: Cardiovascular Disease

## 2019-07-10 VITALS — BP 152/108 | HR 74 | Ht 75.0 in | Wt 274.0 lb

## 2019-07-10 DIAGNOSIS — E119 Type 2 diabetes mellitus without complications: Secondary | ICD-10-CM

## 2019-07-10 DIAGNOSIS — I10 Essential (primary) hypertension: Secondary | ICD-10-CM | POA: Diagnosis not present

## 2019-07-10 DIAGNOSIS — E785 Hyperlipidemia, unspecified: Secondary | ICD-10-CM

## 2019-07-10 MED ORDER — FENOFIBRATE 145 MG PO TABS: 145.0000 mg | ORAL_TABLET | Freq: Every day | ORAL | 1 refills | Status: DC

## 2019-07-10 MED ORDER — ROSUVASTATIN CALCIUM 10 MG PO TABS: 10.0000 mg | ORAL_TABLET | Freq: Every day | ORAL | 1 refills | Status: DC

## 2019-07-10 MED FILL — FENOFIBRATE 145 MG TABLET: 145 | 90 days supply | Qty: 90 | Fill #0

## 2019-07-10 MED FILL — ROSUVASTATIN CALCIUM 10 MG: 10 | 90 days supply | Qty: 90 | Fill #0

## 2019-07-10 NOTE — Progress Notes (Signed)
Cardiology Office Note:    Date:  07/10/2019   ID:  [name], DOB 04-29-1971, MRN [id number]  PCP:  Mordecai Maes, MD  Cardiologist:  No primary care provider on file.  Electrophysiologist:  None   Referring MD: Georgann Housekeeper, MD   Chief Complaint  Patient presents with  . Coronary Artery Disease    History of Present Illness:    Wayne Ho is a 49 y.o. male with a hx of diabetes,, hyperlipidemia, hypertension., obesity . He recently had a coronary calcium score that was elevated.  We asked to see him today by Dr. Eula Listen for further evaluation.  Coronary calcium score from June 06, 2019 was 1395 which places him at the 99th percentile for age/gender matched controls.  Wayne Ho had Covid back in end of January.   This was his 2nd episode of COVID . Had no symptoms with the first episode.   2nd episode had fever, diarrhea. Took the monoclonal antibody  And felt much better in 6 hours   No angina but he does not get any regular exercise He is outdoors frequently ,  - hunts , fishes. Went deer hunting and bear hunting this winter - hiked for miles without any angine, no dyspnea   Cholesterol levels drawn May 24, 2019 showed total cholesterol of 158, HDL 33, LDL 66, triglyceride levels 380.  Hemoglobin A1c is 9.6.  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  Eats lots of steak  Perhaps 2 steaks at a time .  Eggs   Has greatly cut his carbs.  Is not a sweet eater.   Still drinks sugar drinks   Lunch is lots of meat,  Fast foods.( throws the breat away)  No vegetables to speak of .    Past Medical History:  Diagnosis Date  . Chronic chest pain   . DIABETES MELLITUS, TYPE II, CONTROLLED, MILD 07/27/2009  . Dyslipidemia   . ED (erectile dysfunction)   . ESOPHAGITIS NEC 12/19/2006  . GERD  (gastroesophageal reflux disease)   . Hypertension   . Obesity   . OBSTRUCTIVE SLEEP APNEA 11/02/2007    Past Surgical History:  Procedure Laterality Date  . ANTERIOR CRUCIATE LIGAMENT REPAIR     both knees  . CHOLECYSTECTOMY  11/07    Current Medications: Current Meds  Medication Sig  . glucose blood (ACCU-CHEK AVIVA) test strip 1 each by Other route as needed. Use as instructed   . lisinopril (ZESTRIL) 5 MG tablet Take 5 mg by mouth daily.  . metFORMIN (GLUCOPHAGE) 1000 MG tablet Take 1 tablet (1,000 mg total) by mouth daily with supper.  Marland Kitchen omeprazole (PRILOSEC) 20 MG capsule Take 20 mg by mouth as needed.  . sildenafil (VIAGRA) 100 MG tablet Take 0.5-1 tablets (50-100 mg total) by mouth daily as needed for erectile dysfunction.  . TRULICITY 1.5 MG/0.5ML SOPN Take 1.5 mg by mouth once a week.  . [DISCONTINUED] canagliflozin (INVOKANA) 100 MG TABS tablet Take 1 tablet (100 mg total) by mouth daily before breakfast.  . [DISCONTINUED] simvastatin (ZOCOR) 5 MG tablet Take 5 mg by mouth at bedtime.     Allergies:   Patient has no known allergies.   Social History   Socioeconomic History  . Marital status: Married  Spouse name: Not on file  . Number of children: Not on file  . Years of education: Not on file  . Highest education level: Not on file  Occupational History  . Not on file  Tobacco Use  . Smoking status: Never Smoker  . Smokeless tobacco: Current User    Types: Chew  Substance and Sexual Activity  . Alcohol use: No    Alcohol/week: 0.0 standard drinks  . Drug use: No  . Sexual activity: Yes  Other Topics Concern  . 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 Difficulty of Paying Living Expenses: Not on file  Food Insecurity:   . Worried About Charity fundraiser in the Last  Year: Not on file  . Ran Out of Food in the Last Year: Not on file  Transportation Needs:   . Lack of Transportation (Medical): Not on file  . Lack of Transportation (Non-Medical): Not on file  Physical Activity:   . Days of Exercise per Week: Not on file  . Minutes of Exercise per Session: Not on file  Stress:   . Feeling of Stress : Not on file  Social Connections:   . Frequency of Communication with Friends and Family: Not on file  . Frequency of Social Gatherings with Friends and Family: Not on file  . Attends Religious Services: Not on file  . Active Member of Clubs or Organizations: Not on file  . Attends Archivist Meetings: Not on file  . Marital Status: Not on file     Family History: The patient's family history includes Coronary artery disease in an other family member; Early death in his father; Heart disease in his father; Hypertension in his mother.  ROS:   Please see the history of present illness.     All other systems reviewed and are negative.  EKGs/Labs/Other Studies Reviewed:    The following studies were reviewed today:   EKG:  EKG July 10, 2019: Normal sinus rhythm at 74.  No ST or T wave changes.  Recent Labs: No results found for requested labs within last 8760 hours.  Recent Lipid Panel    Component Value Date/Time   CHOL 139 01/18/2016 0932   TRIG 94.0 01/18/2016 0932   HDL 38.80 (L) 01/18/2016 0932   CHOLHDL 4 01/18/2016 0932   VLDL 18.8 01/18/2016 0932   LDLCALC 82 01/18/2016 0932   LDLDIRECT 115.1 12/12/2013 0816    Physical Exam:    VS:  BP (!) 152/108   Pulse 74   Ht 6\' 3"  (1.905 m)   Wt 274 lb (124.3 kg)   SpO2 99%   BMI 34.25 kg/m     Wt Readings from Last 3 Encounters:  07/10/19 274 lb (124.3 kg)  07/11/16 289 lb 12.8 oz (131.5 kg)  01/25/16 290 lb 9.6 oz (131.8 kg)     GEN:  Large middle man,  Mildly obese Well nourished, well developed in no acute distress HEENT: Normal NECK: No JVD; No carotid  bruits LYMPHATICS: No lymphadenopathy CARDIAC: RRR, no murmurs, rubs, gallops RESPIRATORY:  Clear to auscultation without rales, wheezing or rhonchi  ABDOMEN: Soft, non-tender, non-distended MUSCULOSKELETAL:  No edema; No deformity  SKIN: Warm and dry NEUROLOGIC:  Alert and oriented x 3 PSYCHIATRIC:  Normal  affect   ASSESSMENT:    1. Type 2 diabetes mellitus without complication, unspecified whether long term insulin use (HCC)   2. Hyperlipidemia, unspecified hyperlipidemia type   3. Essential hypertension, benign    PLAN:    In order of problems listed above:  1. Coronary artery calcifications: Wayne Ho presents with a very high coronary calcium score.  He is in the 99th percentile for age/gender matched controls.  He eats a very high fat diet.  He is cut lots of his carbohydrates and has lost over 80 pounds over the past year.  He still admits that he eats lots of steak, sausage and other fatty meats.  We had a extensive review of his diet.  I encouraged him to greatly cut out all of his fat containing foods and to steer more towards grilled or baked chicken and grilled or baked fish.  He needs to incorporate more vegetables into his diet. We will draw a lipid profile later this week including a lipoprotein a.  We will discontinue the Zocor and start him on rosuvastatin 10 mg a day.  I will see him back in 3 months for follow-up office visit, fasting labs.  2.  Obesity: He is lost over 80 pounds.  His goal is 250 pounds.  Commended his effort on this but he still needs to work on reducing the fat in his diet.  3.  Hypertension: His blood pressure is fairly well controlled.  He will continue to work on weight loss.   Medication Adjustments/Labs and Tests Ordered: Current medicines are reviewed at length with the patient today.  Concerns regarding medicines are outlined above.  Orders Placed This Encounter  Procedures  . NMR, lipoprofile  . Lipoprotein A (LPA)  . Lipid Profile  .  Hepatic function panel  . Basic metabolic panel  . EKG 12-Lead   Meds ordered this encounter  Medications  . rosuvastatin (CRESTOR) 10 MG tablet    Sig: Take 1 tablet (10 mg total) by mouth daily.    Dispense:  90 tablet    Refill:  1  . fenofibrate (TRICOR) 145 MG tablet    Sig: Take 1 tablet (145 mg total) by mouth daily.    Dispense:  90 tablet    Refill:  1    Patient Instructions  Medication Instructions:   STOP TAKING SIMVASTATIN NOW  START TAKING ROSUVASTATIN 10 MG ONCE DAILY   START TAKING FENOFIBRATE 145 MG BY MOUTH DAILY  *If you need a refill on your cardiac medications before your next appointment, please call your pharmacy*   Lab Work:  1. THIS WEEK TO CHECK--NMR LIPOPROFILE AND LIPOPROTEIN A--PLEASE COME FASTING TO THIS LAB APPOINTMENT  2.  IN 3 MONTHS--SAME DAY AS YOU WILL SEE DR. Nicholad Kautzman FOR YOUR 3 MONTH FOLLOW-UP APPOINTMENT IN THE OFFICE--WE WILL CHECK AT THAT TIME--BMET, LFTs, AND LIPIDS--ALSO PLEASE COME FASTING TO THIS LAB APPOINTMENT  If you have labs (blood work) drawn today and your tests are completely normal, you will receive your results only by: Marland Kitchen MyChart Message (if you have MyChart) OR . A paper copy in the mail If you have any lab test that is abnormal or we need to change your treatment, we will call you to review the results.    Follow-Up:  IN 3 MONTHS IN THE OFFICE WITH DR. Prescott Parma WILL NEED TO HAVE 2ND ROUND OF LABS DONE SAME DAY AS THIS APPOINTMENT TO CHECK BMET, LFTs, AND LIPIDS       Signed,  Kristeen Miss, MD  07/10/2019 5:51 PM    [facility]

## 2019-07-10 NOTE — Patient Instructions (Signed)
Medication Instructions:   STOP TAKING SIMVASTATIN NOW  START TAKING ROSUVASTATIN 10 MG ONCE DAILY   START TAKING FENOFIBRATE 145 MG BY MOUTH DAILY  *If you need a refill on your cardiac medications before your next appointment, please call your pharmacy*   Lab Work:  1. THIS WEEK TO CHECK--NMR LIPOPROFILE AND LIPOPROTEIN A--PLEASE COME FASTING TO THIS LAB APPOINTMENT  2.  IN 3 MONTHS--SAME DAY AS YOU WILL SEE DR. NAHSER FOR YOUR 3 MONTH FOLLOW-UP APPOINTMENT IN THE OFFICE--WE WILL CHECK AT THAT TIME--BMET, LFTs, AND LIPIDS--ALSO PLEASE COME FASTING TO THIS LAB APPOINTMENT  If you have labs (blood work) drawn today and your tests are completely normal, you will receive your results only by: Marland Kitchen MyChart Message (if you have MyChart) OR . A paper copy in the mail If you have any lab test that is abnormal or we need to change your treatment, we will call you to review the results.    Follow-Up:  IN 3 MONTHS IN THE OFFICE WITH DR. Prescott Parma WILL NEED TO HAVE 2ND ROUND OF LABS DONE SAME DAY AS THIS APPOINTMENT TO CHECK BMET, LFTs, AND LIPIDS

## 2019-07-11 ENCOUNTER — Other Ambulatory Visit: Payer: 59 | Admitting: *Deleted

## 2019-07-11 DIAGNOSIS — E119 Type 2 diabetes mellitus without complications: Secondary | ICD-10-CM | POA: Diagnosis not present

## 2019-07-11 DIAGNOSIS — E785 Hyperlipidemia, unspecified: Secondary | ICD-10-CM | POA: Diagnosis not present

## 2019-07-11 DIAGNOSIS — I10 Essential (primary) hypertension: Secondary | ICD-10-CM

## 2019-07-12 LAB — NMR, LIPOPROFILE
Cholesterol, Total: 145 mg/dL (ref 100–199)
HDL Particle Number: 27.2 umol/L — ABNORMAL LOW (ref >=30.5)
HDL-C: 37 mg/dL — ABNORMAL LOW (ref >39)
LDL Particle Number: 1015 nmol/L — ABNORMAL HIGH (ref <1000)
LDL Size: 20.6 nm (ref >20.5)
LDL-C (NIH Calc): 80 mg/dL (ref 0–99)
LP-IR Score: 55 — ABNORMAL HIGH (ref <=45)
Small LDL Particle Number: 449 nmol/L (ref <=527)
Triglycerides: 163 mg/dL — ABNORMAL HIGH (ref 0–149)

## 2019-07-12 LAB — LIPOPROTEIN A (LPA): Lipoprotein (a): 221.8 nmol/L — ABNORMAL HIGH (ref <75.0)

## 2019-07-15 ENCOUNTER — Telehealth: Payer: Self-pay | Admitting: Nurse Practitioner

## 2019-07-15 DIAGNOSIS — E785 Hyperlipidemia, unspecified: Secondary | ICD-10-CM

## 2019-07-15 DIAGNOSIS — E119 Type 2 diabetes mellitus without complications: Secondary | ICD-10-CM

## 2019-07-15 DIAGNOSIS — E7841 Elevated Lipoprotein(a): Secondary | ICD-10-CM

## 2019-07-15 NOTE — Telephone Encounter (Signed)
-----   Message from Vesta Mixer, MD sent at 07/14/2019  6:45 PM EST ----- LDL particle number is 1015 - just slightly higher than his goal LP (a) is 221.8  - markedly elevated.  Insulin resistance is elevated His coronary calcium score is 99th % .   His LDL is not close to goal but his LP (a) is very elevated and statins will not reduce LP(a)  Please refer to Lipid clinic for consideration of PCSK-9 inhibitor

## 2019-07-15 NOTE — Telephone Encounter (Signed)
Reviewed lab results with patient. Questions were answered to his satisfaction and he agrees to schedule an appointment with Lipid Clinic. He was grateful for the call.

## 2019-07-25 NOTE — Progress Notes (Signed)
Patient ID: Wayne Ho                 DOB: [birth year]                    MRN: [id number]     HPI: Wayne Ho is a 49 y.o. male patient referred to lipid clinic by Dr. Acie Fredrickson. PMH is significant for HTN, HLD, DM, obesity, and OSA. He received a cardiac CT on 06/06/19 which showed a coronary calcium score of 1395 (99th percentile). He was last seen by Dr. Acie Fredrickson on 07/10/19 where his simvastatin was changed to rosuvastatin 10mg  daily and fenofibrate 145mg  daily. His Lp(a), LDL, and WUJ:[id number] ratio was elevated, so pt was referred to lipid clinic.  Patient arrives today for initial visit. He denies any side effects since switching simvastatin to rosuvastatin and fenofibrate. He continues to work on making lifestyle modifications and is on track to lose a total of 100 lb by June. He states that he used to eat red meat for every meal of the day but has been making diet modifications since he last saw Dr. Acie Fredrickson - he does not eat any carbohydrates and endorses eating more vegetables and fish. His job does not allow for him to exercise regularly, but he tries to walk 1 mile at least 4 days of the week.  Current Medications: Rosuvastatin 10mg  daily, fenofibrate 145mg  daily  Intolerances: Simvastatin 5mg  daily (ineffective)  Risk Factors: CAD with positive calcium score in 99th percentile, elevated Lp(a), HTN, DM, obesity, OSA  LDL goal: < 70 mg/dL  Diet: Used to eat 4 steaks/day - now eating more fish. Ate 2 yogurts and a granola bar for breakfast, did not eat lunch yesterday, and ate sea bass, asparagus, an salad for dinner. Drinks diet mountain dew, water, and crown royal.  Exercise: Walks 1 mile on 2 weekdays per week, walks 5 miles on Sundays and Saturdays.  Family History: Pt is adopted and does not know parent's history.  Social History: Never smoker, no alcohol  Labs: 07/11/19: TC 145, TG 163, HDL 27.2, LDL-C 80, LDL particle number 1,015 (GNF:AOZHYQMV number ratio 12.68), Lp(a) 221.8  (simvastatin 5mg  daily) 05/24/19: TC 158, TG 380, HDL 33, LDL 66, A1c 9.6% (no lipid-lowering medication)  Past Medical History:  Diagnosis Date  . Chronic chest pain   . DIABETES MELLITUS, TYPE II, CONTROLLED, MILD 07/27/2009  . Dyslipidemia   . ED (erectile dysfunction)   . ESOPHAGITIS NEC 12/19/2006  . GERD (gastroesophageal reflux disease)   . Hypertension   . Obesity   . OBSTRUCTIVE SLEEP APNEA 11/02/2007    Current Outpatient Medications on File Prior to Visit  Medication Sig Dispense Refill  . aspirin 81 MG tablet Take 81 mg by mouth daily.    . fenofibrate (TRICOR) 145 MG tablet Take 1 tablet (145 mg total) by mouth daily. 90 tablet 1  . glucose blood (ACCU-CHEK AVIVA) test strip 1 each by Other route as needed. Use as instructed     . lisinopril (ZESTRIL) 5 MG tablet Take 5 mg by mouth daily.    . metFORMIN (GLUCOPHAGE) 1000 MG tablet Take 1 tablet (1,000 mg total) by mouth daily with supper. 90 tablet 3  . omeprazole (PRILOSEC) 20 MG capsule Take 20 mg by mouth as needed.    . rosuvastatin (CRESTOR) 10 MG tablet Take 1 tablet (10 mg total) by mouth daily. 90 tablet 1  . sildenafil (VIAGRA) 100 MG tablet Take 0.5-1 tablets (50-100  mg total) by mouth daily as needed for erectile dysfunction. 10 tablet 11  . TRULICITY 1.5 MG/0.5ML SOPN Take 1.5 mg by mouth once a week.     No current facility-administered medications on file prior to visit.    No Known Allergies  Assessment/Plan:  1. Hyperlipidemia - LDL remains elevated and above goal of < 70 mg/dL. His Lipoprotein(a) is also elevated, which is an independent risk factor for ASCVD. Will submit prior authorization for Praluent 75mg  Q2W injections to help lower LDL and lipoprotein(a). Continue rosuvastatin 10mg  daily and fenofibrate 145mg  daily. Discussed the importance of maintaining regular exercise and sticking to a heart-healthy diet that is low in saturated fats and red meat. Pt will try to increase the number of times he  walks and will continue working on decreasing his red meat intake. Will recheck lipid panel 2-3 months after starting injections.  , PharmD PGY1 Pharmacy Resident

## 2019-07-26 ENCOUNTER — Other Ambulatory Visit: Payer: Self-pay

## 2019-07-26 ENCOUNTER — Ambulatory Visit (INDEPENDENT_AMBULATORY_CARE_PROVIDER_SITE_OTHER): Payer: 59 | Admitting: Pharmacist

## 2019-07-26 DIAGNOSIS — E785 Hyperlipidemia, unspecified: Secondary | ICD-10-CM

## 2019-07-26 MED ORDER — PRALUENT 75 MG/ML ~~LOC~~ SOAJ: 1.0000 "pen " | SUBCUTANEOUS | 11 refills | Status: DC

## 2019-07-26 MED FILL — PRALUENT 75 MG/ML SOAJ: 75 | 28 days supply | Qty: 2 | Fill #0

## 2019-07-26 NOTE — Patient Instructions (Addendum)
It was nice to meet you today   Your LDL is 80 and your goal is < 70   I will submit information to your insurance to see if they will cover Praluent injections. This medication is stored in the fridge, is given every 2 weeks into the fatty tissue of your stomach, and lowers your LDL cholesterol by 60% and Lipoprotein(a) by 25%.  Continue taking rosuvastatin 10mg  daily and fenofibrate 145mg  daily   We will recheck your cholesterol levels on 10/14/19

## 2019-07-29 ENCOUNTER — Telehealth: Payer: Self-pay | Admitting: Pharmacist

## 2019-07-29 NOTE — Telephone Encounter (Signed)
Called patient and left message. Praluent is on his insurance formulary and new Rx was sent to [facility]. Copay is $25/month using copay card. Pharmacy will ship medication directly to his house. Follow-up fasting labs already scheduled for 10/14/19.

## 2019-08-01 NOTE — Telephone Encounter (Signed)
Third message left for patient.

## 2019-10-03 MED FILL — TRULICITY 1.5 MG/0.5 ML PEN: 1.5 | 28 days supply | Qty: 2 | Fill #0

## 2019-10-14 ENCOUNTER — Other Ambulatory Visit: Payer: 59

## 2019-10-14 ENCOUNTER — Encounter: Payer: Self-pay | Admitting: Cardiovascular Disease

## 2019-10-14 ENCOUNTER — Other Ambulatory Visit: Payer: Self-pay

## 2019-10-14 ENCOUNTER — Ambulatory Visit (INDEPENDENT_AMBULATORY_CARE_PROVIDER_SITE_OTHER): Payer: 59 | Admitting: Cardiovascular Disease

## 2019-10-14 VITALS — BP 148/90 | HR 74 | Ht 75.0 in | Wt 278.5 lb

## 2019-10-14 DIAGNOSIS — I2584 Coronary atherosclerosis due to calcified coronary lesion: Secondary | ICD-10-CM | POA: Diagnosis not present

## 2019-10-14 DIAGNOSIS — E119 Type 2 diabetes mellitus without complications: Secondary | ICD-10-CM | POA: Diagnosis not present

## 2019-10-14 DIAGNOSIS — I10 Essential (primary) hypertension: Secondary | ICD-10-CM

## 2019-10-14 DIAGNOSIS — I2510 Atherosclerotic heart disease of native coronary artery without angina pectoris: Secondary | ICD-10-CM

## 2019-10-14 DIAGNOSIS — E785 Hyperlipidemia, unspecified: Secondary | ICD-10-CM | POA: Diagnosis not present

## 2019-10-14 DIAGNOSIS — E7841 Elevated Lipoprotein(a): Secondary | ICD-10-CM | POA: Diagnosis not present

## 2019-10-14 DIAGNOSIS — G4733 Obstructive sleep apnea (adult) (pediatric): Secondary | ICD-10-CM | POA: Diagnosis not present

## 2019-10-14 HISTORY — DX: Atherosclerotic heart disease of native coronary artery without angina pectoris: I25.10

## 2019-10-14 LAB — BASIC METABOLIC PANEL
BUN/Creatinine Ratio: 14 (ref 9–20)
BUN: 14 mg/dL (ref 6–24)
CO2: 24 mmol/L (ref 20–29)
Calcium: 9.4 mg/dL (ref 8.7–10.2)
Chloride: 98 mmol/L (ref 96–106)
Creatinine, Ser: 0.99 mg/dL (ref 0.76–1.27)
GFR calc Af Amer: 102 mL/min/{1.73_m2} (ref >59)
GFR calc non Af Amer: 88 mL/min/{1.73_m2} (ref >59)
Glucose: 331 mg/dL — ABNORMAL HIGH (ref 65–99)
Potassium: 4.4 mmol/L (ref 3.5–5.2)
Sodium: 135 mmol/L (ref 134–144)

## 2019-10-14 LAB — LIPID PANEL
Chol/HDL Ratio: 3.9 ratio (ref 0.0–5.0)
Cholesterol, Total: 105 mg/dL (ref 100–199)
HDL: 27 mg/dL — ABNORMAL LOW (ref >39)
LDL Chol Calc (NIH): 52 mg/dL (ref 0–99)
Triglycerides: 149 mg/dL (ref 0–149)
VLDL Cholesterol Cal: 26 mg/dL (ref 5–40)

## 2019-10-14 LAB — HEPATIC FUNCTION PANEL
ALT: 38 IU/L (ref 0–44)
AST: 27 IU/L (ref 0–40)
Albumin: 4.4 g/dL (ref 4.0–5.0)
Alkaline Phosphatase: 98 IU/L (ref 48–121)
Bilirubin Total: 0.8 mg/dL (ref 0.0–1.2)
Bilirubin, Direct: 0.27 mg/dL (ref 0.00–0.40)
Total Protein: 7.1 g/dL (ref 6.0–8.5)

## 2019-10-14 NOTE — Patient Instructions (Addendum)
Medication Instructions:  Your physician recommends that you continue on your current medications as directed. Please refer to the Current Medication list given to you today.  *If you need a refill on your cardiac medications before your next appointment, please call your pharmacy*   Lab Work: TODAY - cholesterol, liver panel, basic metabolic panel If you have labs (blood work) drawn today and your tests are completely normal, you will receive your results only by: Marland Kitchen MyChart Message (if you have MyChart) OR . A paper copy in the mail If you have any lab test that is abnormal or we need to change your treatment, we will call you to review the results.   Testing/Procedures: None Ordered    Follow-Up: At [facility], you and your health needs are our priority.  As part of our continuing mission to provide you with exceptional heart care, we have created designated Provider Care Teams.  These Care Teams include your primary Cardiologist (physician) and Advanced Practice Providers (APPs -  Physician Assistants and Nurse Practitioners) who all work together to provide you with the care you need, when you need it.   Your next appointment:   6 month(s)  The format for your next appointment:   In Person  Provider:   You may see Kristeen Miss, MD or one of the following Advanced Practice Providers on your designated Care Team:    Tereso Newcomer, PA-C  [place], [place]

## 2019-10-14 NOTE — Progress Notes (Signed)
Cardiology Office Note:    Date:  10/14/2019   ID:  Wayne Ho, DOB [birth year], MRN [id number]  PCP:  Trish Fountain, MD  Cardiologist:  Mertie Moores, MD  Electrophysiologist:  None   Referring MD: Trish Fountain, MD   Chief Complaint  Patient presents with  . Hyperlipidemia    History of Present Illness:    Wayne Ho is a 49 y.o. male with a hx of diabetes,, hyperlipidemia, hypertension., obesity . He recently had a coronary calcium score that was elevated.  We asked to see him today by Dr. Deforest Hoyles for further evaluation.  Coronary calcium score from June 06, 2019 was 1395 which places him at the 99th percentile for age/gender matched controls.  Wayne Ho had Covid back in end of January.   This was his 2nd episode of COVID . Had no symptoms with the first episode.   2nd episode had fever, diarrhea. Took the monoclonal antibody  And felt much better in 6 hours   No angina but he does not get any regular exercise He is outdoors frequently ,  - hunts , fishes. Went deer hunting and bear hunting this winter - hiked for miles without any angine, no dyspnea   Cholesterol levels drawn May 24, 2019 showed total cholesterol of 158, HDL 33, LDL 66, triglyceride levels 380.  Hemoglobin A1c is 9.6.  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  Eats lots of steak  Perhaps 2 steaks at a time .  Eggs   Has greatly cut his carbs.  Is not a sweet eater.   Still drinks sugar drinks   Lunch is lots of meat,  Fast foods.( throws the breat away)  No vegetables to speak of .    October 14, 2019: Wayne Ho is seen today for follow-up of his hyperlipidemia. Hx of obesity.  Wt. Today is 278 lbs.  Has a very high coronary calcium score  Had some dizziness - random Watching his diet  No CP or dyspnea  - cut 2 cords of wood this  weekend without any angina   Past Medical History:  Diagnosis Date  . Chronic chest pain   . DIABETES MELLITUS, TYPE II, CONTROLLED, MILD 07/27/2009  . Dyslipidemia   . ED (erectile dysfunction)   . ESOPHAGITIS NEC 12/19/2006  . GERD (gastroesophageal reflux disease)   . Hypertension   . Obesity   . OBSTRUCTIVE SLEEP APNEA 11/02/2007    Past Surgical History:  Procedure Laterality Date  . ANTERIOR CRUCIATE LIGAMENT REPAIR     both knees  . CHOLECYSTECTOMY  11/07    Current Medications: Current Meds  Medication Sig  . Alirocumab (PRALUENT) 75 MG/ML SOAJ Inject 1 pen into the skin every 14 (fourteen) days.  Marland Kitchen aspirin 81 MG tablet Take 81 mg by mouth daily.  . fenofibrate (TRICOR) 145 MG tablet Take 1 tablet (145 mg total) by mouth daily.  Marland Kitchen glucose blood (ACCU-CHEK AVIVA) test strip 1 each by Other route as needed. Use as instructed   . lisinopril (ZESTRIL) 5 MG tablet Take 5 mg by mouth daily.  . metFORMIN (GLUCOPHAGE) 1000 MG tablet Take 1 tablet (1,000 mg total) by mouth daily with supper.  Marland Kitchen omeprazole (PRILOSEC) 20 MG capsule Take 20 mg by mouth as  needed.  . rosuvastatin (CRESTOR) 10 MG tablet Take 1 tablet (10 mg total) by mouth daily.  . sildenafil (VIAGRA) 100 MG tablet Take 0.5-1 tablets (50-100 mg total) by mouth daily as needed for erectile dysfunction.  . TRULICITY 1.5 MG/0.5ML SOPN Take 1.5 mg by mouth once a week.     Allergies:   Patient has no known allergies.   Social History   Socioeconomic History  . Marital status: Married    Spouse name: Not on file  . Number of children: Not on file  . Years of education: Not on file  . Highest education level: Not on file  Occupational History  . Not on file  Tobacco Use  . Smoking status: Never Smoker  . Smokeless tobacco: Current User    Types: Chew  Substance and Sexual Activity  . Alcohol use: No    Alcohol/week: 0.0 standard drinks  . Drug use: No  . Sexual activity: Yes  Other Topics Concern  . 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 Difficulty of Paying Living Expenses:   Food Insecurity:   . Worried About Programme researcher, broadcasting/film/video in the Last Year:   . Barista in the Last Year:   Transportation Needs:   . Freight forwarder (Medical):   Marland Kitchen Lack of Transportation (Non-Medical):   Physical Activity:   . Days of Exercise per Week:   . Minutes of Exercise per Session:   Stress:   . Feeling of Stress :   Social Connections:   . Frequency of Communication with Friends and Family:   . Frequency of Social Gatherings with Friends and Family:   . Attends Religious Services:   . Active Member of Clubs or Organizations:   . Attends Banker Meetings:   Marland Kitchen Marital Status:      Family History: The patient's family history includes Coronary artery disease in an other family member; Early death in his father; Heart disease in his father; Hypertension in his mother.  ROS:   Please see the history of present illness.     All other systems reviewed and are negative.  EKGs/Labs/Other Studies Reviewed:    The following studies were reviewed today:   EKG:     Recent Labs: 10/14/2019: ALT 38; BUN 14; Creatinine, Ser 0.99; Potassium 4.4; Sodium 135  Recent Lipid Panel    Component Value Date/Time   CHOL 105 10/14/2019 0841   TRIG 149 10/14/2019 0841   HDL 27 (L) 10/14/2019 0841   CHOLHDL 3.9 10/14/2019 0841   CHOLHDL 4 01/18/2016 0932   VLDL 18.8 01/18/2016 0932   LDLCALC 52 10/14/2019 0841   LDLDIRECT 115.1 12/12/2013 0816    Physical Exam:    Physical Exam: Blood pressure (!) 148/90, pulse 74, height 6\' 3"  (1.905 m), weight 278 lb 8 oz (126.3 kg), SpO2 98 %.  GEN:  Middle age male,  NAD , moderately obese  HEENT: Normal NECK: No JVD; No carotid bruits LYMPHATICS: No  lymphadenopathy CARDIAC: RRR , no murmurs, rubs, gallops RESPIRATORY:  Clear to auscultation without rales, wheezing or rhonchi  ABDOMEN: Soft, non-tender, non-distended MUSCULOSKELETAL:  No edema; No deformity  SKIN: Warm and dry NEUROLOGIC:  Alert and oriented x 3   ASSESSMENT:  1. Type 2 diabetes mellitus without complication, unspecified whether long term insulin use (HCC)   2. Essential hypertension, benign   3. Hyperlipidemia, unspecified hyperlipidemia type   4. Elevated lipoprotein A level   5. OSA (obstructive sleep apnea)    PLAN:    In order of problems listed above:  1.  Coronary artery calcifications: He denies having any angina.  Continue Repatha and rosuvastatin.  Will check NMR lipid profile today, liver enzymes, basic metabolic profile.  We will continue with aggressive cholesterol management.  2.  Obesity:   Cont weight loss efforts  3.  Hypertension:   BP is typically well controlled.   Medication Adjustments/Labs and Tests Ordered: Current medicines are reviewed at length with the patient today.  Concerns regarding medicines are outlined above.  Orders Placed This Encounter  Procedures  . Lipid Profile  . Hepatic function panel  . Basic Metabolic Panel (BMET)  . Split night study   No orders of the defined types were placed in this encounter.   Patient Instructions  Medication Instructions:  Your physician recommends that you continue on your current medications as directed. Please refer to the Current Medication list given to you today.  *If you need a refill on your cardiac medications before your next appointment, please call your pharmacy*   Lab Work: TODAY - cholesterol, liver panel, basic metabolic panel If you have labs (blood work) drawn today and your tests are completely normal, you will receive your results only by: Marland Kitchen MyChart Message (if you have MyChart) OR . A paper copy in the mail If you have any lab test that is abnormal or we  need to change your treatment, we will call you to review the results.   Testing/Procedures: None Ordered    Follow-Up: At [facility], you and your health needs are our priority.  As part of our continuing mission to provide you with exceptional heart care, we have created designated Provider Care Teams.  These Care Teams include your primary Cardiologist (physician) and Advanced Practice Providers (APPs -  Physician Assistants and Nurse Practitioners) who all work together to provide you with the care you need, when you need it.   Your next appointment:   6 month(s)  The format for your next appointment:   In Person  Provider:   You may see Kristeen Miss, MD or one of the following Advanced Practice Providers on your designated Care Team:    Tereso Newcomer, PA-C  Chelsea Aus, [place]        Signed, Kristeen Miss, MD  10/14/2019 4:43 PM    [facility]

## 2019-10-17 ENCOUNTER — Ambulatory Visit
Admission: RE | Admit: 2019-10-17 | Discharge: 2019-10-17 | Disposition: A | Discharge status: Home / Self Care | Payer: 59 | Source: Ambulatory Visit | Attending: Internal Medicine | Admitting: Internal Medicine

## 2019-10-17 DIAGNOSIS — J181 Lobar pneumonia, unspecified organism: Secondary | ICD-10-CM | POA: Diagnosis not present

## 2019-10-17 DIAGNOSIS — R918 Other nonspecific abnormal finding of lung field: Secondary | ICD-10-CM

## 2019-10-17 MED ORDER — IOPAMIDOL (ISOVUE-300) INJECTION 61%
75.0000 mL | Freq: Once | INTRAVENOUS | Status: AC | PRN
  Administered 2019-10-17: 75 mL via INTRAVENOUS

## 2019-10-21 ENCOUNTER — Other Ambulatory Visit (HOSPITAL_BASED_OUTPATIENT_CLINIC_OR_DEPARTMENT_OTHER): Payer: Self-pay | Admitting: Internal Medicine

## 2019-10-21 DIAGNOSIS — K760 Fatty (change of) liver, not elsewhere classified: Secondary | ICD-10-CM | POA: Diagnosis not present

## 2019-10-21 DIAGNOSIS — I700 Atherosclerosis of aorta: Secondary | ICD-10-CM | POA: Diagnosis not present

## 2019-10-21 DIAGNOSIS — E785 Hyperlipidemia, unspecified: Secondary | ICD-10-CM | POA: Diagnosis not present

## 2019-10-21 DIAGNOSIS — E1165 Type 2 diabetes mellitus with hyperglycemia: Secondary | ICD-10-CM | POA: Diagnosis not present

## 2019-10-21 MED FILL — TRULICITY 3 MG/0.5ML SOPN: 3 | 28 days supply | Qty: 2 | Fill #0

## 2019-12-06 DIAGNOSIS — E1165 Type 2 diabetes mellitus with hyperglycemia: Secondary | ICD-10-CM | POA: Diagnosis not present

## 2019-12-06 DIAGNOSIS — K219 Gastro-esophageal reflux disease without esophagitis: Secondary | ICD-10-CM | POA: Diagnosis not present

## 2019-12-06 DIAGNOSIS — I700 Atherosclerosis of aorta: Secondary | ICD-10-CM | POA: Diagnosis not present

## 2019-12-06 DIAGNOSIS — I10 Essential (primary) hypertension: Secondary | ICD-10-CM | POA: Diagnosis not present

## 2019-12-06 DIAGNOSIS — E785 Hyperlipidemia, unspecified: Secondary | ICD-10-CM | POA: Diagnosis not present

## 2019-12-06 DIAGNOSIS — I2510 Atherosclerotic heart disease of native coronary artery without angina pectoris: Secondary | ICD-10-CM | POA: Diagnosis not present

## 2019-12-06 DIAGNOSIS — G4730 Sleep apnea, unspecified: Secondary | ICD-10-CM | POA: Diagnosis not present

## 2019-12-06 MED FILL — LISINOPRIL 10 MG TABS: 10 | 90 days supply | Qty: 90 | Fill #0

## 2019-12-13 ENCOUNTER — Other Ambulatory Visit (HOSPITAL_BASED_OUTPATIENT_CLINIC_OR_DEPARTMENT_OTHER): Payer: Self-pay

## 2019-12-13 DIAGNOSIS — G4710 Hypersomnia, unspecified: Secondary | ICD-10-CM

## 2019-12-13 DIAGNOSIS — R0681 Apnea, not elsewhere classified: Secondary | ICD-10-CM

## 2019-12-13 DIAGNOSIS — R0683 Snoring: Secondary | ICD-10-CM

## 2019-12-26 DIAGNOSIS — H5213 Myopia, bilateral: Secondary | ICD-10-CM | POA: Diagnosis not present

## 2019-12-26 DIAGNOSIS — H2513 Age-related nuclear cataract, bilateral: Secondary | ICD-10-CM | POA: Diagnosis not present

## 2019-12-26 DIAGNOSIS — H524 Presbyopia: Secondary | ICD-10-CM | POA: Diagnosis not present

## 2019-12-26 DIAGNOSIS — H52223 Regular astigmatism, bilateral: Secondary | ICD-10-CM | POA: Diagnosis not present

## 2019-12-26 DIAGNOSIS — E119 Type 2 diabetes mellitus without complications: Secondary | ICD-10-CM | POA: Diagnosis not present

## 2019-12-31 MED FILL — METFORMIN HCL 1000 MG TABS: 1000 | 90 days supply | Qty: 180 | Fill #0

## 2019-12-31 MED FILL — TRULICITY 3 MG/0.5ML SOPN: 3 | 28 days supply | Qty: 2 | Fill #1

## 2020-01-06 DIAGNOSIS — H35373 Puckering of macula, bilateral: Secondary | ICD-10-CM | POA: Diagnosis not present

## 2020-01-06 DIAGNOSIS — E113413 Type 2 diabetes mellitus with severe nonproliferative diabetic retinopathy with macular edema, bilateral: Secondary | ICD-10-CM | POA: Diagnosis not present

## 2020-01-06 DIAGNOSIS — H35033 Hypertensive retinopathy, bilateral: Secondary | ICD-10-CM | POA: Diagnosis not present

## 2020-01-06 DIAGNOSIS — H43823 Vitreomacular adhesion, bilateral: Secondary | ICD-10-CM | POA: Diagnosis not present

## 2020-01-17 ENCOUNTER — Other Ambulatory Visit: Payer: Self-pay

## 2020-01-17 ENCOUNTER — Ambulatory Visit (INDEPENDENT_AMBULATORY_CARE_PROVIDER_SITE_OTHER): Payer: 59 | Admitting: Cardiovascular Disease

## 2020-01-17 ENCOUNTER — Encounter: Payer: Self-pay | Admitting: Cardiovascular Disease

## 2020-01-17 VITALS — BP 166/110 | HR 78 | Ht 75.0 in | Wt 274.4 lb

## 2020-01-17 DIAGNOSIS — I10 Essential (primary) hypertension: Secondary | ICD-10-CM | POA: Diagnosis not present

## 2020-01-17 DIAGNOSIS — I2584 Coronary atherosclerosis due to calcified coronary lesion: Secondary | ICD-10-CM

## 2020-01-17 DIAGNOSIS — E785 Hyperlipidemia, unspecified: Secondary | ICD-10-CM

## 2020-01-17 DIAGNOSIS — I2510 Atherosclerotic heart disease of native coronary artery without angina pectoris: Secondary | ICD-10-CM | POA: Diagnosis not present

## 2020-01-17 NOTE — Patient Instructions (Signed)
Medication Instructions:  Your physician recommends that you continue on your current medications as directed. Please refer to the Current Medication list given to you today.  *If you need a refill on your cardiac medications before your next appointment, please call your pharmacy*   Lab Work: TODAY: NMR, APO B, LIPO A, BMET, LFTS  If you have labs (blood work) drawn today and your tests are completely normal, you will receive your results only by:  MyChart Message (if you have MyChart) OR  A paper copy in the mail If you have any lab test that is abnormal or we need to change your treatment, we will call you to review the results.   Testing/Procedures: Your physician has requested that you have a lexiscan myoview. For further information please visit [email or web address] Please follow instruction sheet, as given.   Follow-Up: At [facility], you and your health needs are our priority.  As part of our continuing mission to provide you with exceptional heart care, we have created designated Provider Care Teams.  These Care Teams include your primary Cardiologist (physician) and Advanced Practice Providers (APPs -  Physician Assistants and Nurse Practitioners) who all work together to provide you with the care you need, when you need it.  We recommend signing up for the patient portal called "MyChart".  Sign up information is provided on this After Visit Summary.  MyChart is used to connect with patients for Virtual Visits (Telemedicine).  Patients are able to view lab/test results, encounter notes, upcoming appointments, etc.  Non-urgent messages can be sent to your provider as well.   To learn more about what you can do with MyChart, go to [email or web address].    Your next appointment:   6 month(s)  The format for your next appointment:   In Person  Provider:   You may see Kristeen Miss, MD or one of the following Advanced Practice Providers on your designated Care Team:     Tereso Newcomer, PA-C  Chelsea Aus, [place]    Other Instructions None

## 2020-01-17 NOTE — Progress Notes (Signed)
Cardiology Office Note:    Date:  01/17/2020   ID:  Wayne Ho, DOB Jan 02, 1971, MRN [id number]  PCP:  Mordecai Maes, MD  Cardiologist:  Kristeen Miss, MD  Electrophysiologist:  None   Referring MD: Georgann Housekeeper, MD   Chief Complaint  Patient presents with   Hyperlipidemia    July 10, 2019   Wayne Ho is a 49 y.o. male with a hx of diabetes,, hyperlipidemia, hypertension., obesity . He recently had a coronary calcium score that was elevated.  We asked to see him today by Dr. Eula Listen for further evaluation.  Coronary calcium score from June 06, 2019 was 1395 which places him at the 99th percentile for age/gender matched controls.  Wayne Ho had Covid back in end of January.   This was his 2nd episode of COVID . Had no symptoms with the first episode.   2nd episode had fever, diarrhea. Took the monoclonal antibody  And felt much better in 6 hours   No angina but he does not get any regular exercise He is outdoors frequently ,  - hunts , fishes. Went deer hunting and bear hunting this winter - hiked for miles without any angine, no dyspnea   Cholesterol levels drawn May 24, 2019 showed total cholesterol of 158, HDL 33, LDL 66, triglyceride levels 380.  Hemoglobin A1c is 9.6.  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  Eats lots of steak  Perhaps 2 steaks at a time .  Eggs   Has greatly cut his carbs.  Is not a sweet eater.   Still drinks sugar drinks   Lunch is lots of meat,  Fast foods.( throws the breat away)  No vegetables to speak of .    October 14, 2019: Wayne Ho is seen today for follow-up of his hyperlipidemia. Hx of obesity.  Wt. Today is 278 lbs.  Has a very high coronary calcium score  Had some dizziness - random Watching his diet  No CP or dyspnea  - cut 2 cords of wood this weekend without  any angina   January 17, 2020: Wayne Ho is seen back today for follow-up of his coronary artery calcifications, hyperlipidemia, hypertension, obesity.   Total Agatston Score: 1,395 MESA database percentile: 99th  Does not have any chest pain.  Has some doe with exertion.   Wt today is 274 lbs. ( down 4 lbs)  He is looking into a study using Trodusquemine. ( approaved in the [place])    Past Medical History:  Diagnosis Date   Chronic chest pain    DIABETES MELLITUS, TYPE II, CONTROLLED, MILD 07/27/2009   Dyslipidemia    ED (erectile dysfunction)    ESOPHAGITIS NEC 12/19/2006   GERD (gastroesophageal reflux disease)    Hypertension    Obesity    OBSTRUCTIVE SLEEP APNEA 11/02/2007    Past Surgical History:  Procedure Laterality Date   ANTERIOR CRUCIATE LIGAMENT REPAIR     both knees   CHOLECYSTECTOMY  11/07    Current Medications: Current Meds  Medication Sig   Alirocumab (PRALUENT) 75 MG/ML SOAJ Inject 1 pen into the skin every 14 (fourteen) days.   aspirin 81 MG tablet Take 81 mg by mouth daily.   fenofibrate (TRICOR) 145 MG tablet Take 1 tablet (145 mg  total) by mouth daily.   glucose blood (ACCU-CHEK AVIVA) test strip 1 each by Other route as needed. Use as instructed    lisinopril (ZESTRIL) 5 MG tablet Take 5 mg by mouth daily.   metFORMIN (GLUCOPHAGE) 1000 MG tablet Take 1 tablet (1,000 mg total) by mouth daily with supper.   omeprazole (PRILOSEC) 20 MG capsule Take 20 mg by mouth as needed.   rosuvastatin (CRESTOR) 10 MG tablet Take 1 tablet (10 mg total) by mouth daily.   sildenafil (VIAGRA) 100 MG tablet Take 0.5-1 tablets (50-100 mg total) by mouth daily as needed for erectile dysfunction.   TRULICITY 3 MG/0.5ML SOPN Inject 3 mg into the skin once a week.   [DISCONTINUED] TRULICITY 1.5 MG/0.5ML SOPN Take 1.5 mg by mouth once a week.     Allergies:   Patient has no known allergies.   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, beyond [employer]   Wife works in oncology   Social Determinants of Corporate investment banker Strain:    Difficulty of Paying Living Expenses: Not on file  Food Insecurity:    Worried About Programme researcher, broadcasting/film/video in the Last Year: Not on file   [employer] of Food in the Last Year: Not on file  Transportation Needs:    Lack of Transportation (Medical): Not on file   Lack of Transportation (Non-Medical): Not on file  Physical Activity:    Days of Exercise per Week: Not on file   Minutes of Exercise per Session: Not on file  Stress:    Feeling of Stress : Not on file  Social Connections:    Frequency of Communication with Friends and Family: Not on file   Frequency of Social Gatherings with Friends and Family: Not on file   Attends Religious Services: Not on file   Active Member of Clubs or Organizations: Not on file   Attends Banker Meetings: Not on file   Marital Status: Not on file     Family History: The patient's family history includes Coronary artery disease in an other family member; Early death in his father; Heart disease in his father; Hypertension in his mother.  ROS:   Please see the history of present illness.     All other systems reviewed and are negative.  EKGs/Labs/Other Studies Reviewed:    The following studies were reviewed today:   EKG:     Recent Labs: 10/14/2019: ALT 38; BUN  14; Creatinine, Ser 0.99; Potassium 4.4; Sodium 135  Recent Lipid Panel    Component Value Date/Time   CHOL 105 10/14/2019 0841   TRIG 149 10/14/2019 0841   HDL 27 (L) 10/14/2019 0841   CHOLHDL 3.9 10/14/2019 0841   CHOLHDL 4 01/18/2016 0932   VLDL 18.8 01/18/2016 0932  LDLCALC 52 10/14/2019 0841   LDLDIRECT 115.1 12/12/2013 0816    Physical Exam:    Physical Exam: Blood pressure (!) 166/110, pulse 78, height 6\' 3"  (1.905 m), weight 274 lb 6.4 oz (124.5 kg), SpO2 98 %.  GEN:  Large male,  Mildly - moderately obese, NAD  HEENT: Normal NECK: No JVD; No carotid bruits LYMPHATICS: No lymphadenopathy CARDIAC: RRR , no murmurs, rubs, gallops RESPIRATORY:  Clear to auscultation without rales, wheezing or rhonchi  ABDOMEN: Soft, non-tender, non-distended MUSCULOSKELETAL:  No edema; No deformity  SKIN: Warm and dry NEUROLOGIC:  Alert and oriented x 3    ASSESSMENT:    1. Coronary artery calcification   2. Essential hypertension   3. Hyperlipidemia, unspecified hyperlipidemia type    PLAN:     :  1.  Coronary artery calcifications:    He denies chest pain but he does have some shortness of breath with exertion that might be an anginal equivalent.  He has a very high coronary calcium score.  We will schedule him for a 2-day Lexiscan Myoview study for further evaluation.  Continue rosuvastatin and fenofibrate for now.   2.  Obesity:    He is working on a weight loss program.  He is lost 4 pounds since his last visit.  3.  Hypertension:   He has hyperlipidemia including high triglycerides and cholesterol.  He is on rosuvastatin and fenofibrate.  Will check in in a more lipid profile today, liver enzymes, basic metabolic profile.  Medication Adjustments/Labs and Tests Ordered: Current medicines are reviewed at length with the patient today.  Concerns regarding medicines are outlined above.  Orders Placed This Encounter  Procedures   NMR, lipoprofile   Apolipoprotein B    Lipoprotein A (LPA)   Basic metabolic panel   Hepatic function panel   MYOCARDIAL PERFUSION IMAGING   No orders of the defined types were placed in this encounter.     Patient Instructions  Medication Instructions:  Your physician recommends that you continue on your current medications as directed. Please refer to the Current Medication list given to you today.  *If you need a refill on your cardiac medications before your next appointment, please call your pharmacy*   Lab Work: TODAY: NMR, APO B, LIPO A, BMET, LFTS  If you have labs (blood work) drawn today and your tests are completely normal, you will receive your results only by:  MyChart Message (if you have MyChart) OR  A paper copy in the mail If you have any lab test that is abnormal or we need to change your treatment, we will call you to review the results.   Testing/Procedures: Your physician has requested that you have a lexiscan myoview. For further information please visit . Please follow instruction sheet, as given.   Follow-Up: At [facility], you and your health needs are our priority.  As part of our continuing mission to provide you with exceptional heart care, we have created designated Provider Care Teams.  These Care Teams include your primary Cardiologist (physician) and Advanced Practice Providers (APPs -  Physician Assistants and Nurse Practitioners) who all work together to provide you with the care you need, when you need it.  We recommend signing up for the patient portal called "MyChart".  Sign up information is provided on this After Visit Summary.  MyChart is used to connect with patients for Virtual Visits (Telemedicine).  Patients are able to view lab/test results, encounter notes, upcoming appointments, etc.  Non-urgent messages  can be sent to your provider as well.   To learn more about what you can do with MyChart, go to [email or web address].    Your next  appointment:   6 month(s)  The format for your next appointment:   In Person  Provider:   You may see Kristeen MissPhilip Connar Keating, MD or one of the following Advanced Practice Providers on your designated Care Team:    Tereso NewcomerScott Weaver, PA-C  Chelsea AusVin Bhagat, [place]-C    Other Instructions None     Signed, Kristeen MissPhilip Edgar Corrigan, MD  01/17/2020 1:28 PM    [facility]

## 2020-01-20 ENCOUNTER — Telehealth: Payer: Self-pay

## 2020-01-20 ENCOUNTER — Other Ambulatory Visit: Payer: Self-pay

## 2020-01-20 DIAGNOSIS — I10 Essential (primary) hypertension: Secondary | ICD-10-CM

## 2020-01-20 DIAGNOSIS — E785 Hyperlipidemia, unspecified: Secondary | ICD-10-CM

## 2020-01-20 DIAGNOSIS — E119 Type 2 diabetes mellitus without complications: Secondary | ICD-10-CM

## 2020-01-20 LAB — HEPATIC FUNCTION PANEL
ALT: 29 IU/L (ref 0–44)
AST: 23 IU/L (ref 0–40)
Albumin: 4.8 g/dL (ref 3.8–4.9)
Alkaline Phosphatase: 76 IU/L (ref 48–121)
Bilirubin Total: 1 mg/dL (ref 0.0–1.2)
Bilirubin, Direct: 0.29 mg/dL (ref 0.00–0.40)
Total Protein: 7.9 g/dL (ref 6.0–8.5)

## 2020-01-20 LAB — NMR, LIPOPROFILE
Cholesterol, Total: 130 mg/dL (ref 100–199)
HDL Particle Number: 26.3 umol/L — ABNORMAL LOW (ref >=30.5)
HDL-C: 37 mg/dL — ABNORMAL LOW (ref >39)
LDL Particle Number: 1055 nmol/L — ABNORMAL HIGH (ref <1000)
LDL Size: 20.6 nm (ref >20.5)
LDL-C (NIH Calc): 73 mg/dL (ref 0–99)
LP-IR Score: 64 — ABNORMAL HIGH (ref <=45)
Small LDL Particle Number: 625 nmol/L — ABNORMAL HIGH (ref <=527)
Triglycerides: 111 mg/dL (ref 0–149)

## 2020-01-20 LAB — BASIC METABOLIC PANEL
BUN/Creatinine Ratio: 12 (ref 9–20)
BUN: 13 mg/dL (ref 6–24)
CO2: 22 mmol/L (ref 20–29)
Calcium: 10.1 mg/dL (ref 8.7–10.2)
Chloride: 99 mmol/L (ref 96–106)
Creatinine, Ser: 1.08 mg/dL (ref 0.76–1.27)
GFR calc Af Amer: 91 mL/min/{1.73_m2} (ref >59)
GFR calc non Af Amer: 79 mL/min/{1.73_m2} (ref >59)
Glucose: 193 mg/dL — ABNORMAL HIGH (ref 65–99)
Potassium: 4.4 mmol/L (ref 3.5–5.2)
Sodium: 136 mmol/L (ref 134–144)

## 2020-01-20 LAB — APOLIPOPROTEIN B: Apolipoprotein B: 73 mg/dL (ref <90)

## 2020-01-20 LAB — LIPOPROTEIN A (LPA): Lipoprotein (a): 270.1 nmol/L — ABNORMAL HIGH (ref <75.0)

## 2020-01-20 MED ORDER — ROSUVASTATIN CALCIUM 20 MG PO TABS: 20.0000 mg | ORAL_TABLET | Freq: Every day | ORAL | 3 refills | Status: DC

## 2020-01-20 NOTE — Telephone Encounter (Signed)
-----   Message from Vesta Mixer, MD sent at 01/20/2020 10:00 AM EDT ----- LDL particle number is slightly higher.  Insulin resistance is slightly worse.  Lets increase the crestor to 20 mg a day ( assuming he is tolerating it )  Check lipids, liver , bmp in 3 months  Wayne Ho,  I checked with our lipid specialist, Dr. Rennis Golden.   He is not famialier with Trodusquemine and we do not have any investigational studies pending with it

## 2020-01-20 NOTE — Telephone Encounter (Signed)
The patient has been notified of the result and verbalized understanding.  All questions (if any) were answered. Patient will repeat lab work on 04/15/20. Will send in Crestor 20 mg to patient's pharmacy of choice. Patient wanted Dr. Elease Hashimoto to know he was not fasting for these labs. Dr. Elease Hashimoto stated it would not make much of a difference. Will let patient know through Mychart. Ethelda Chick, RN 01/20/2020 10:40 AM

## 2020-01-21 MED FILL — ROSUVASTATIN CALCIUM 20 MG: 20 | 90 days supply | Qty: 90 | Fill #0

## 2020-01-24 ENCOUNTER — Ambulatory Visit (HOSPITAL_BASED_OUTPATIENT_CLINIC_OR_DEPARTMENT_OTHER): Payer: 59 | Attending: Internal Medicine | Admitting: Internal Medicine

## 2020-01-24 ENCOUNTER — Other Ambulatory Visit: Payer: Self-pay

## 2020-01-24 DIAGNOSIS — R0902 Hypoxemia: Secondary | ICD-10-CM | POA: Insufficient documentation

## 2020-01-24 DIAGNOSIS — R0683 Snoring: Secondary | ICD-10-CM

## 2020-01-24 DIAGNOSIS — G4733 Obstructive sleep apnea (adult) (pediatric): Secondary | ICD-10-CM | POA: Insufficient documentation

## 2020-01-24 DIAGNOSIS — R0681 Apnea, not elsewhere classified: Secondary | ICD-10-CM

## 2020-01-24 DIAGNOSIS — G4710 Hypersomnia, unspecified: Secondary | ICD-10-CM

## 2020-02-03 DIAGNOSIS — G4733 Obstructive sleep apnea (adult) (pediatric): Secondary | ICD-10-CM | POA: Diagnosis not present

## 2020-02-03 NOTE — Procedures (Signed)
    NAME: Wayne Ho DATE OF BIRTH:  [birth year] MEDICAL RECORD NUMBER [id number]  LOCATION: [facility]  PHYSICIAN: Deretha Emory  DATE OF STUDY: 01/24/2020  SLEEP STUDY TYPE: Out of Center Sleep Test                REFERRING PHYSICIAN: Deretha Emory, MD  INDICATION FOR STUDY: witnessed apnea, snoring, daytime fatigue  EPWORTH SLEEPINESS SCORE:  5 HEIGHT:    WEIGHT:      There is no height or weight on file to calculate BMI.  NECK SIZE: 19 in.  SLEEP STUDY TECHNIQUE A multi-channel overnight portable sleep study was performed. The channels recorded were: nasal and oral airflow, thoracic and abdominal respiratory movement, and oxygen saturation with a pulse oximetry. Snoring and body position were also monitored.  TECHNICIAN COMMENTS Comments added by Technician: N/A Comments added by Scorer: N/A  RECORDING SUMMARY The study was initiated at 11:46:04 PM and terminated at 5:31:10 AM. The total recorded time was 345.1 minutes. Time in bed was 344.6 minutes.  RESPIRATORY PARAMETERS The overall AHI was 43.2 per hour, with a central apnea index of 0.0 per hour. The patient was supine for 0%. The oxygen nadir was 81% during sleep.  CARDIAC DATA Mean heart rate during sleep was 77.3 bpm.  IMPRESSIONS - Severe Obstructive Sleep apnea(OSA) - Moderate Oxygen Desaturation - No Significant Central Sleep Apnea (CSA)  DIAGNOSIS - Obstructive Sleep Apnea (G47.33) - Nocturnal Hypoxemia (G47.36)  RECOMMENDATIONS - CPAP is recommended as the treatment of choice.  Deretha Emory Sleep specialist, American Board of Internal Medicine  ELECTRONICALLY SIGNED ON:  02/03/2020, 8:29 PM [facility] PH: [phone]   FX: [phone] ACCREDITED BY THE AMERICAN ACADEMY OF SLEEP MEDICINE

## 2020-02-10 ENCOUNTER — Telehealth (HOSPITAL_COMMUNITY): Payer: Self-pay

## 2020-02-10 NOTE — Telephone Encounter (Signed)
Spoke with the patient, detailed instructions given. He stated that he would be here for his test. Asked to call back with any questions. S.Atticus Wedin EMTP 

## 2020-02-13 ENCOUNTER — Other Ambulatory Visit: Payer: Self-pay

## 2020-02-13 ENCOUNTER — Ambulatory Visit (HOSPITAL_COMMUNITY): Payer: 59 | Attending: Cardiology

## 2020-02-13 DIAGNOSIS — I2584 Coronary atherosclerosis due to calcified coronary lesion: Secondary | ICD-10-CM | POA: Diagnosis not present

## 2020-02-13 DIAGNOSIS — E785 Hyperlipidemia, unspecified: Secondary | ICD-10-CM

## 2020-02-13 DIAGNOSIS — I2510 Atherosclerotic heart disease of native coronary artery without angina pectoris: Secondary | ICD-10-CM | POA: Diagnosis not present

## 2020-02-13 DIAGNOSIS — I10 Essential (primary) hypertension: Secondary | ICD-10-CM | POA: Diagnosis not present

## 2020-02-13 MED ORDER — TECHNETIUM TC 99M TETROFOSMIN IV KIT
32.4000 | PACK | Freq: Once | INTRAVENOUS | Status: AC | PRN
  Administered 2020-02-13: 32.4 via INTRAVENOUS
  Filled 2020-02-13: qty 33

## 2020-02-13 MED ORDER — REGADENOSON 0.4 MG/5ML IV SOLN
0.4000 mg | Freq: Once | INTRAVENOUS | Status: AC
  Administered 2020-02-13: 0.4 mg via INTRAVENOUS

## 2020-02-14 ENCOUNTER — Ambulatory Visit (HOSPITAL_COMMUNITY): Payer: 59 | Attending: Cardiology

## 2020-02-14 LAB — MYOCARDIAL PERFUSION IMAGING
LV dias vol: 111 mL (ref 62–150)
LV sys vol: 58 mL
Peak HR: 103 {beats}/min
Rest HR: 86 {beats}/min
SDS: 0
SRS: 0
SSS: 0
TID: 1.01

## 2020-02-14 MED FILL — TRULICITY 3 MG/0.5ML SOPN: 3 | 28 days supply | Qty: 2 | Fill #2

## 2020-02-18 ENCOUNTER — Telehealth: Payer: Self-pay | Admitting: Cardiovascular Disease

## 2020-02-18 NOTE — Telephone Encounter (Signed)
Skip is returning [name] and [name]'s call in regards to his Lexiscan results. Please advise.

## 2020-02-18 NOTE — Telephone Encounter (Signed)
No symptoms of angina Cont current plan

## 2020-02-18 NOTE — Telephone Encounter (Signed)
I spoke with patient and reviewed stress test results with him.  He hiked in the mountains on Saturday and did not have any chest pain.  Had some shortness of breath on exertion but this resolved with rest. Reports he felt fine. He will let us know if he develops chest pain or increasing shortness of breath. Patient requests Dr Elease Hashimoto refill his BP medications when needed.  I told him Lisinopril was only medication he had listed for BP.  Patient reports he has not been taking Lisinopril for at least 6 months. Patient does not have medication list with him and will call us back if he is taking a medication for BP.

## 2020-02-25 MED FILL — FENOFIBRATE 145 MG TABS: 145 | 90 days supply | Qty: 90 | Fill #0

## 2020-03-27 MED FILL — TRULICITY 3 MG/0.5ML SOPN: 3 | 28 days supply | Qty: 2 | Fill #3

## 2020-04-15 ENCOUNTER — Other Ambulatory Visit: Payer: 59

## 2020-04-20 ENCOUNTER — Other Ambulatory Visit: Payer: 59

## 2020-05-08 MED FILL — TRULICITY 3 MG/0.5ML SOPN: 3 | 28 days supply | Qty: 2 | Fill #4

## 2020-05-18 DIAGNOSIS — G4733 Obstructive sleep apnea (adult) (pediatric): Secondary | ICD-10-CM | POA: Diagnosis not present

## 2020-06-03 MED FILL — TRULICITY 3 MG/0.5ML SOPN: 3 | 28 days supply | Qty: 2 | Fill #5

## 2020-06-18 DIAGNOSIS — G4733 Obstructive sleep apnea (adult) (pediatric): Secondary | ICD-10-CM | POA: Diagnosis not present

## 2020-07-13 MED FILL — TRULICITY 3 MG/0.5ML SOPN: 3 | 28 days supply | Qty: 2 | Fill #6

## 2020-07-13 MED FILL — ROSUVASTATIN CALCIUM 20 MG: 20 | 90 days supply | Qty: 90 | Fill #1

## 2020-07-16 ENCOUNTER — Encounter: Payer: Self-pay | Admitting: Cardiovascular Disease

## 2020-07-16 DIAGNOSIS — G4733 Obstructive sleep apnea (adult) (pediatric): Secondary | ICD-10-CM | POA: Diagnosis not present

## 2020-07-16 NOTE — Progress Notes (Signed)
Cardiology Office Note:    Date:  07/17/2020   ID:  Wayne Ho, DOB 1971/04/08, MRN [id number]  PCP:  Mordecai Maes, MD  Cardiologist:  Kristeen Miss, MD  Electrophysiologist:  None   Referring MD: Mordecai Maes, MD   Chief Complaint  Patient presents with  . Coronary Artery Disease    July 10, 2019   ASRIEL Ho is a 50 y.o. male with a hx of diabetes,, hyperlipidemia, hypertension., obesity . He recently had a coronary calcium score that was elevated.  We asked to see him today by Dr. Eula Listen for further evaluation.  Coronary calcium score from June 06, 2019 was 1395 which places him at the 99th percentile for age/gender matched controls.  Dannis had Covid back in end of January.   This was his 2nd episode of COVID . Had no symptoms with the first episode.   2nd episode had fever, diarrhea. Took the monoclonal antibody  And felt much better in 6 hours   No angina but he does not get any regular exercise He is outdoors frequently ,  - hunts , fishes. Went deer hunting and bear hunting this winter - hiked for miles without any angine, no dyspnea   Cholesterol levels drawn May 24, 2019 showed total cholesterol of 158, HDL 33, LDL 66, triglyceride levels 380.  Hemoglobin A1c is 9.6.  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  Eats lots of steak  Perhaps 2 steaks at a time .  Eggs   Has greatly cut his carbs.  Is not a sweet eater.   Still drinks sugar drinks   Lunch is lots of meat,  Fast foods.( throws the breat away)  No vegetables to speak of .    October 14, 2019: Elridge is seen today for follow-up of his hyperlipidemia. Hx of obesity.  Wt. Today is 278 lbs.  Has a very high coronary calcium score  Had some dizziness - random Watching his diet  No CP or dyspnea  - cut 2 cords of wood this  weekend without any angina   January 17, 2020: [name] is seen back today for follow-up of his coronary artery calcifications, hyperlipidemia, hypertension, obesity.   Total Agatston Score: 1,395 MESA database percentile: 99th  Does not have any chest pain.  Has some doe with exertion.   Wt today is 274 lbs. ( down 4 lbs)  He is looking into a study using Trodusquemine. ( approaved in the [place])    July 17, 2020: [name] is seen today for follow up of his coronary calcifications, HLD, obesity, HTN Total Agatston Score: 1,395 MESA database percentile: 99th  Lexiscan myoview in Oct. 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 Chronic chest pain   . DIABETES MELLITUS, TYPE II, CONTROLLED, MILD 07/27/2009  . Dyslipidemia   . ED (erectile dysfunction)   . ESOPHAGITIS NEC 12/19/2006  . GERD (gastroesophageal reflux disease)   . Hypertension   . Obesity   . OBSTRUCTIVE SLEEP APNEA 11/02/2007    Past  Surgical History:  Procedure Laterality Date  . ANTERIOR CRUCIATE LIGAMENT REPAIR     both knees  . CHOLECYSTECTOMY  11/07    Current Medications: Current Meds  Medication Sig  . aspirin 81 MG tablet Take 81 mg by mouth daily.  Marland Kitchen glucose blood test strip 1 each by Other route as needed. Use as instructed  . metFORMIN (GLUCOPHAGE) 1000 MG tablet Take 1 tablet (1,000 mg total) by mouth daily with supper.  Marland Kitchen omeprazole (PRILOSEC) 20 MG capsule Take 20 mg by mouth as needed.  . rosuvastatin (CRESTOR) 20 MG tablet Take 1 tablet (20 mg total) by mouth daily.  . sildenafil (VIAGRA) 100 MG tablet Take 0.5-1 tablets (50-100 mg total) by mouth daily as needed for erectile dysfunction.  . TRULICITY 3 MG/0.5ML SOPN Inject 3 mg into the skin once a week.  . valsartan (DIOVAN) 320 MG tablet  Take 1 tablet (320 mg total) by mouth daily.  . [DISCONTINUED] Alirocumab (PRALUENT) 75 MG/ML SOAJ Inject 1 pen into the skin every 14 (fourteen) days.  . [DISCONTINUED] fenofibrate (TRICOR) 145 MG tablet Take 1 tablet (145 mg total) by mouth daily.  . [DISCONTINUED] lisinopril (ZESTRIL) 10 MG tablet Take 10 mg by mouth daily.     Allergies:   Patient has no known allergies.   Social History   Socioeconomic History  . Marital status: Married    Spouse name: Not on file  . Number of children: Not on file  . Years of education: Not on file  . Highest education level: Not on file  Occupational History  . Not on file  Tobacco Use  . Smoking status: Never Smoker  . Smokeless tobacco: Current User    Types: Chew  Substance and Sexual Activity  . Alcohol use: No    Alcohol/week: 0.0 standard drinks  . Drug use: No  . Sexual activity: Yes  Other Topics Concern  . 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  ROS:   Please see the history of present illness.     All other systems reviewed and are negative.  EKGs/Labs/Other Studies Reviewed:    The following studies were reviewed today:   EKG:     Recent Labs: 01/17/2020: ALT 29; BUN 13; Creatinine, Ser 1.08; Potassium 4.4; Sodium 136  Recent Lipid Panel    Component Value Date/Time   CHOL 105 10/14/2019 0841   TRIG 149 10/14/2019 0841   HDL 27 (L) 10/14/2019 0841   CHOLHDL 3.9 10/14/2019  0841   CHOLHDL 4 01/18/2016 0932   VLDL 18.8 01/18/2016 0932   LDLCALC 52 10/14/2019 0841   LDLDIRECT 115.1 12/12/2013 0816    Physical Exam:    Physical Exam: Blood pressure (!) 162/98, pulse 75, height 6\' 3"  (1.905 m), weight 276 lb 12.8 oz (125.6 kg), SpO2 98 %.  GEN:  Well nourished, well developed in no acute distress HEENT: Normal NECK: No JVD;  No carotid bruits LYMPHATICS: No lymphadenopathy CARDIAC: RRR , no murmurs, rubs, gallops RESPIRATORY:  Clear to auscultation without rales, wheezing or rhonchi  ABDOMEN: Soft, non-tender, non-distended MUSCULOSKELETAL:  No edema; No deformity  SKIN: Warm and dry NEUROLOGIC:  Alert and oriented x 3     ASSESSMENT:    1. Type 2 diabetes mellitus without complication, unspecified whether long term insulin use (HCC)   2. Hyperlipidemia, unspecified hyperlipidemia type   3. Essential hypertension, benign    PLAN:     :  1.  Coronary artery calcifications:     He denies having any angina symptoms.  He does have high coronary calcium score and we are aggressively treating his hyperlipidemia.  Continue Repatha.   2.  Obesity:     Last him to continue with his weight loss efforts.   3.  Hypertension:    .  Blood pressure is elevated today.  We will stop the lisinopril and start him on valsartan 320 mg a day.  If his blood pressure remains elevated we will likely need to start him on a diuretic.  He will continue to eat a low-salt diet.  Continue weight loss efforts.  Medication Adjustments/Labs and Tests Ordered: Current medicines are reviewed at length with the patient today.  Concerns regarding medicines are outlined above.  Orders Placed This Encounter  Procedures  . ALT  . Basic metabolic panel  . NMR, lipoprofile  . EKG 12-Lead   Meds ordered this encounter  Medications  . Alirocumab (PRALUENT) 75 MG/ML SOAJ    Sig: Inject 1 pen into the skin every 14 (fourteen) days.    Dispense:  2 mL    Refill:  0  .  fenofibrate (TRICOR) 145 MG tablet    Sig: Take 1 tablet (145 mg total) by mouth daily.    Dispense:  90 tablet    Refill:  3  . valsartan (DIOVAN) 320 MG tablet    Sig: Take 1 tablet (320 mg total) by mouth daily.    Dispense:  90 tablet    Refill:  3      Patient Instructions  Medication Instructions:  Your physician has recommended you make the following change in your medication:   STOP Lisinopril START Valsartan 320mg  daily   *If you need a refill on your cardiac medications before your next appointment, please call your pharmacy*   Lab Work: NMR, ALT, BMET Your physician recommends that you return for lab work in: 3 weeks   You will need to FAST for this appointment - nothing to eat or drink after midnight the night before except water.    Testing/Procedures: none   Follow-Up: At [facility], you and your health needs are our priority.  As part of our continuing mission to provide you with exceptional heart care, we have created designated Provider Care Teams.  These Care Teams include your primary Cardiologist (physician) and Advanced Practice Providers (APPs -  Physician Assistants and Nurse Practitioners) who all work together to provide you with the care you need, when you need it.   Your next appointment:   6 month(s)  The format for your next appointment:   In Person  Provider:   You will see one of the following Advanced Practice Providers on your designated Care Team:    [facility], PA-C  Tereso Newcomer, Chelsea Aus     Signed, [place], MD  07/17/2020 10:30 AM    [facility]

## 2020-07-17 ENCOUNTER — Encounter: Payer: Self-pay | Admitting: Cardiovascular Disease

## 2020-07-17 ENCOUNTER — Other Ambulatory Visit: Payer: Self-pay | Admitting: Cardiovascular Disease

## 2020-07-17 ENCOUNTER — Other Ambulatory Visit: Payer: Self-pay

## 2020-07-17 ENCOUNTER — Ambulatory Visit (INDEPENDENT_AMBULATORY_CARE_PROVIDER_SITE_OTHER): Payer: 59 | Admitting: Cardiovascular Disease

## 2020-07-17 VITALS — BP 162/98 | HR 75 | Ht 75.0 in | Wt 276.8 lb

## 2020-07-17 DIAGNOSIS — I10 Essential (primary) hypertension: Secondary | ICD-10-CM | POA: Diagnosis not present

## 2020-07-17 DIAGNOSIS — I2584 Coronary atherosclerosis due to calcified coronary lesion: Secondary | ICD-10-CM

## 2020-07-17 DIAGNOSIS — E785 Hyperlipidemia, unspecified: Secondary | ICD-10-CM

## 2020-07-17 DIAGNOSIS — E119 Type 2 diabetes mellitus without complications: Secondary | ICD-10-CM

## 2020-07-17 DIAGNOSIS — I2510 Atherosclerotic heart disease of native coronary artery without angina pectoris: Secondary | ICD-10-CM | POA: Diagnosis not present

## 2020-07-17 MED ORDER — VALSARTAN 320 MG PO TABS: 320.0000 mg | ORAL_TABLET | Freq: Every day | ORAL | 3 refills | Status: DC

## 2020-07-17 MED ORDER — PRALUENT 75 MG/ML ~~LOC~~ SOAJ: 1.0000 "pen " | SUBCUTANEOUS | 0 refills | Status: DC

## 2020-07-17 MED ORDER — FENOFIBRATE 145 MG PO TABS: 145.0000 mg | ORAL_TABLET | Freq: Every day | ORAL | 3 refills | Status: DC

## 2020-07-17 MED FILL — VALSARTAN 320 MG TABLET: 320 | 90 days supply | Qty: 90 | Fill #0

## 2020-07-17 MED FILL — FENOFIBRATE 145 MG TABS: 145 | 90 days supply | Qty: 90 | Fill #0

## 2020-07-17 NOTE — Patient Instructions (Signed)
Medication Instructions:  Your physician has recommended you make the following change in your medication:   STOP Lisinopril START Valsartan 320mg  daily   *If you need a refill on your cardiac medications before your next appointment, please call your pharmacy*   Lab Work: NMR, ALT, BMET Your physician recommends that you return for lab work in: 3 weeks   You will need to FAST for this appointment - nothing to eat or drink after midnight the night before except water.    Testing/Procedures: none   Follow-Up: At [facility], you and your health needs are our priority.  As part of our continuing mission to provide you with exceptional heart care, we have created designated Provider Care Teams.  These Care Teams include your primary Cardiologist (physician) and Advanced Practice Providers (APPs -  Physician Assistants and Nurse Practitioners) who all work together to provide you with the care you need, when you need it.   Your next appointment:   6 month(s)  The format for your next appointment:   In Person  Provider:   You will see one of the following Advanced Practice Providers on your designated Care Team:    [facility], PA-C  Wayne Ho, Wayne Ho

## 2020-07-20 ENCOUNTER — Other Ambulatory Visit: Payer: Self-pay | Admitting: Pharmacist

## 2020-07-20 MED ORDER — PRALUENT 75 MG/ML ~~LOC~~ SOAJ: 1.0000 "pen " | SUBCUTANEOUS | 11 refills | Status: DC

## 2020-07-28 ENCOUNTER — Other Ambulatory Visit (HOSPITAL_BASED_OUTPATIENT_CLINIC_OR_DEPARTMENT_OTHER): Payer: Self-pay

## 2020-08-04 ENCOUNTER — Other Ambulatory Visit (HOSPITAL_BASED_OUTPATIENT_CLINIC_OR_DEPARTMENT_OTHER): Payer: Self-pay

## 2020-08-07 ENCOUNTER — Other Ambulatory Visit: Payer: 59

## 2020-08-08 ENCOUNTER — Other Ambulatory Visit (HOSPITAL_COMMUNITY): Payer: Self-pay

## 2020-08-11 DIAGNOSIS — G4733 Obstructive sleep apnea (adult) (pediatric): Secondary | ICD-10-CM | POA: Diagnosis not present

## 2020-08-11 DIAGNOSIS — E1165 Type 2 diabetes mellitus with hyperglycemia: Secondary | ICD-10-CM | POA: Diagnosis not present

## 2020-08-11 DIAGNOSIS — E1140 Type 2 diabetes mellitus with diabetic neuropathy, unspecified: Secondary | ICD-10-CM | POA: Diagnosis not present

## 2020-08-11 DIAGNOSIS — I700 Atherosclerosis of aorta: Secondary | ICD-10-CM | POA: Diagnosis not present

## 2020-08-11 DIAGNOSIS — E785 Hyperlipidemia, unspecified: Secondary | ICD-10-CM | POA: Diagnosis not present

## 2020-08-11 DIAGNOSIS — I10 Essential (primary) hypertension: Secondary | ICD-10-CM | POA: Diagnosis not present

## 2020-08-11 DIAGNOSIS — Z125 Encounter for screening for malignant neoplasm of prostate: Secondary | ICD-10-CM | POA: Diagnosis not present

## 2020-08-11 DIAGNOSIS — I2510 Atherosclerotic heart disease of native coronary artery without angina pectoris: Secondary | ICD-10-CM | POA: Diagnosis not present

## 2020-08-11 DIAGNOSIS — Z0000 Encounter for general adult medical examination without abnormal findings: Secondary | ICD-10-CM | POA: Diagnosis not present

## 2020-08-16 DIAGNOSIS — G4733 Obstructive sleep apnea (adult) (pediatric): Secondary | ICD-10-CM | POA: Diagnosis not present

## 2020-08-25 ENCOUNTER — Other Ambulatory Visit (HOSPITAL_BASED_OUTPATIENT_CLINIC_OR_DEPARTMENT_OTHER): Payer: Self-pay

## 2020-08-25 MED FILL — Dulaglutide Soln Auto-injector 3 MG/0.5ML: SUBCUTANEOUS | 28 days supply | Qty: 2 | Fill #0 | Status: AC

## 2020-09-15 DIAGNOSIS — G4733 Obstructive sleep apnea (adult) (pediatric): Secondary | ICD-10-CM | POA: Diagnosis not present

## 2020-10-01 ENCOUNTER — Other Ambulatory Visit (HOSPITAL_BASED_OUTPATIENT_CLINIC_OR_DEPARTMENT_OTHER): Payer: Self-pay

## 2020-10-01 MED FILL — Dulaglutide Soln Auto-injector 3 MG/0.5ML: SUBCUTANEOUS | 28 days supply | Qty: 2 | Fill #1 | Status: AC

## 2020-10-07 ENCOUNTER — Other Ambulatory Visit (HOSPITAL_BASED_OUTPATIENT_CLINIC_OR_DEPARTMENT_OTHER): Payer: Self-pay

## 2020-10-16 DIAGNOSIS — G4733 Obstructive sleep apnea (adult) (pediatric): Secondary | ICD-10-CM | POA: Diagnosis not present

## 2020-11-10 ENCOUNTER — Other Ambulatory Visit (HOSPITAL_COMMUNITY): Payer: Self-pay

## 2020-11-10 ENCOUNTER — Other Ambulatory Visit (HOSPITAL_BASED_OUTPATIENT_CLINIC_OR_DEPARTMENT_OTHER): Payer: Self-pay

## 2020-11-10 MED ORDER — PAXLOVID 20 X 150 MG & 10 X 100MG PO TBPK
ORAL_TABLET | ORAL | 0 refills | Status: DC
  Filled 2020-11-10 (×3): qty 30, 5d supply, fill #0

## 2020-11-16 IMAGING — CT CT HEART SCORING
3 series · 14 of 20 positions shown, 16 images · non-contrast
Comparison: None.

CLINICAL DATA: Screening for heart disease.

EXAM:
CT CARDIAC CORONARY ARTERY CALCIUM SCORE
TECHNIQUE: Non-contrast imaging through the heart was performed using
prospective ECG gating. Image post processing was performed on an
independent workstation, allowing for quantitative analysis of the
heart and coronary arteries. Note that this exam targets the heart
and the chest was not imaged in its entirety.

[Series 2: calcium scoring 2.00 qr36 bestdiast 71% · axial · 0.40mm/px · z∈[+1846,+1942]mm · 4 of 80 slices shown]
[im 16/80  vessel]
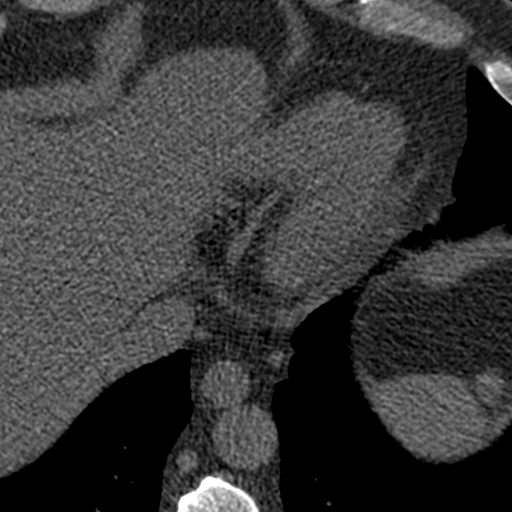
[im 32/80  vessel]
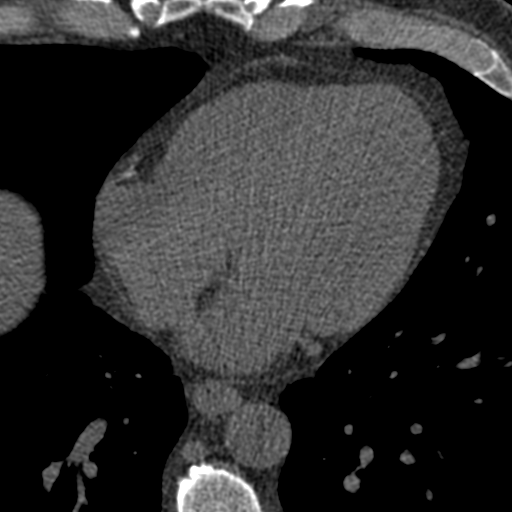
[im 48/80  vessel]
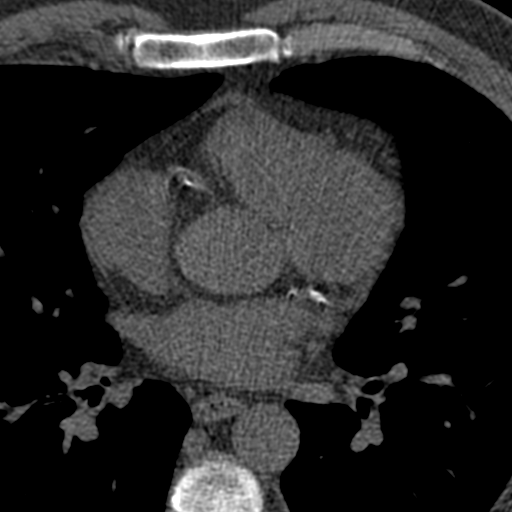
[im 64/80  vessel]
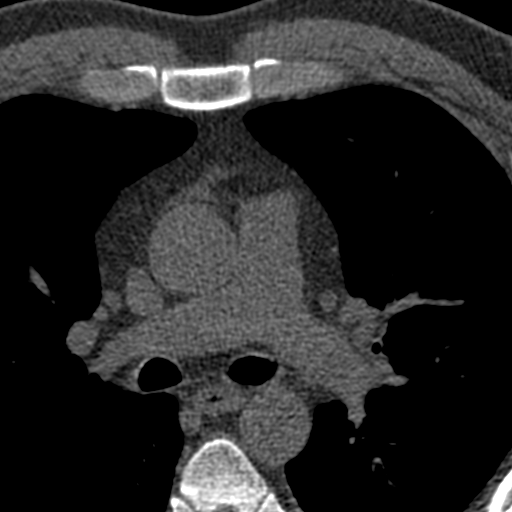

[Series 3: calcium scoring 2.00 br40 bestdiast 71% fov · axial · 0.71mm/px · z∈[+1842,+1946]mm · 5 of 80 slices shown, 7 images]
[im 14/80  vessel]
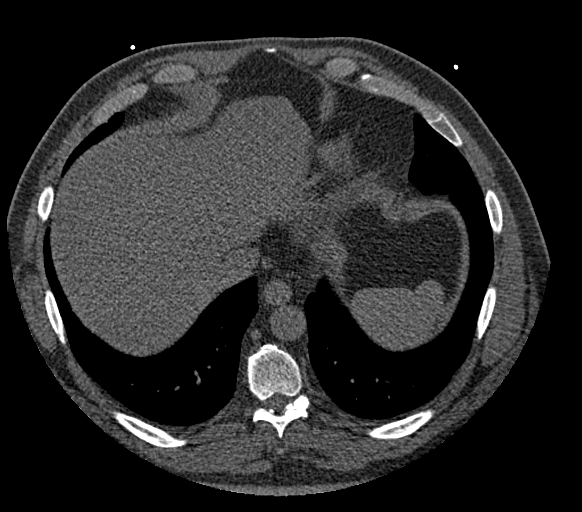
[im 14/80  lung]
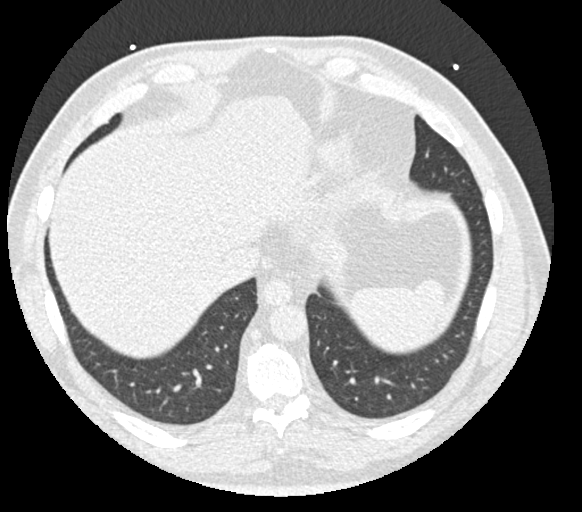
[im 27/80  vessel]
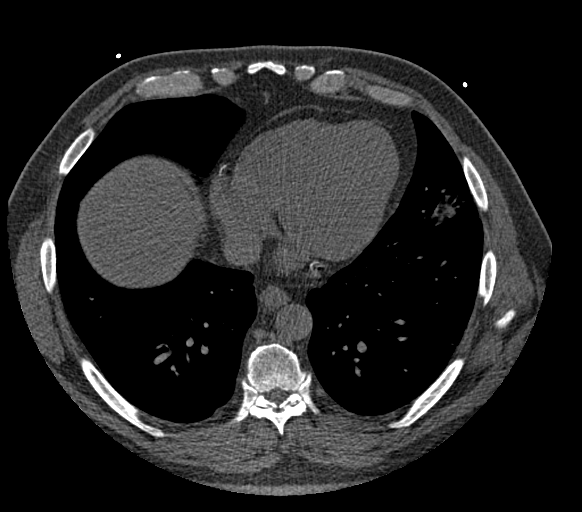
[im 40/80  vessel]
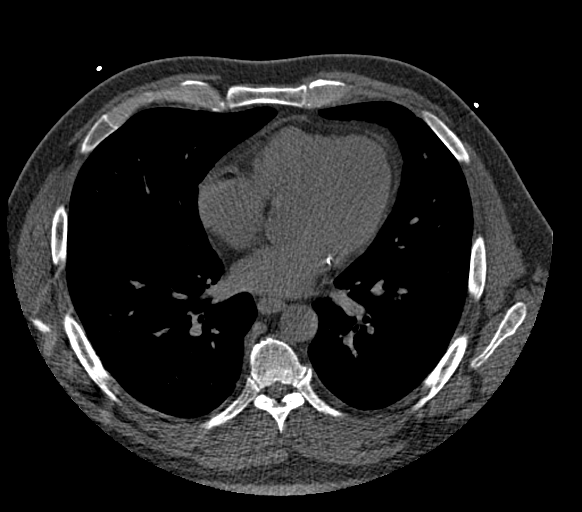
[im 53/80  vessel]
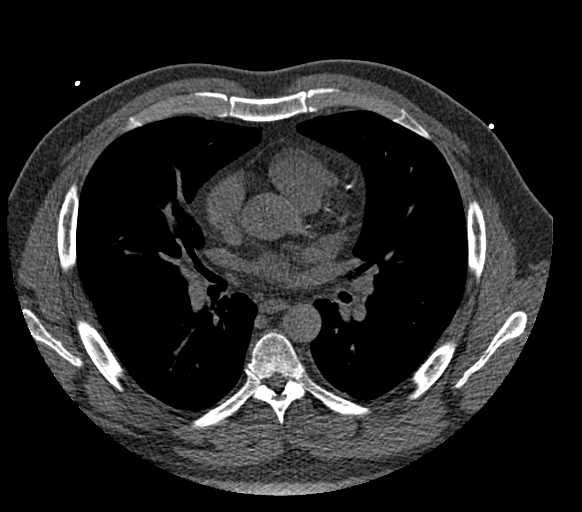
[im 66/80  vessel]
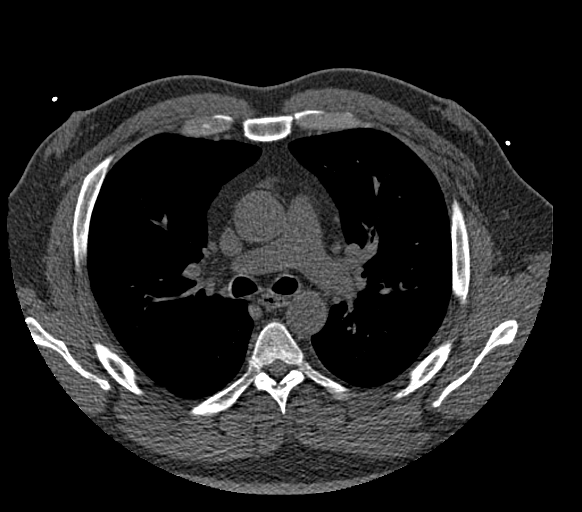
[im 66/80  lung]
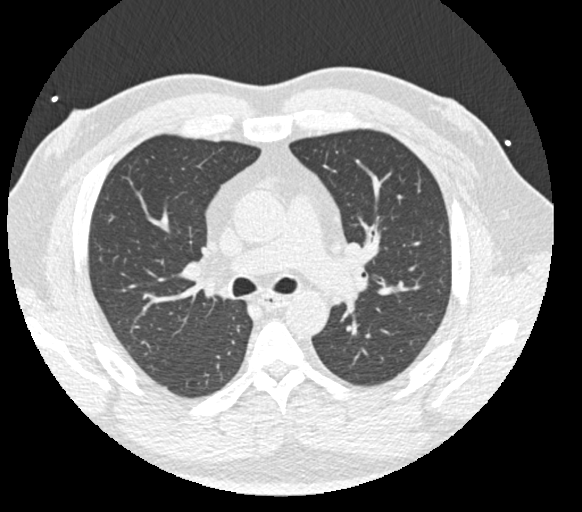

[Series 9: calcium scoring 2.00 br60 bestdiast 71% fov · axial · 0.71mm/px · z∈[+1842,+1946]mm · 5 of 80 slices shown]
[im 14/80  vessel]
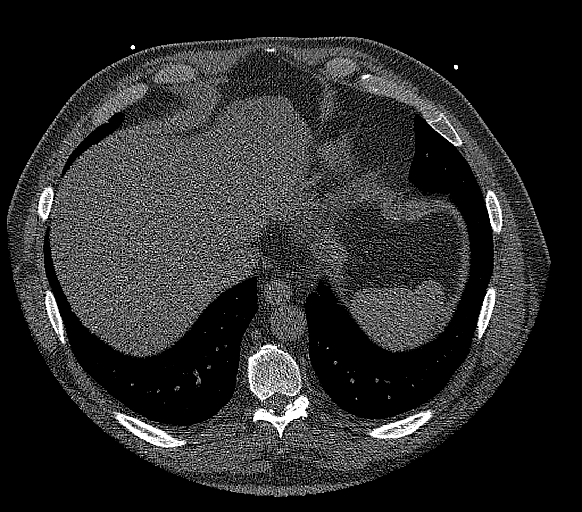
[im 27/80  vessel]
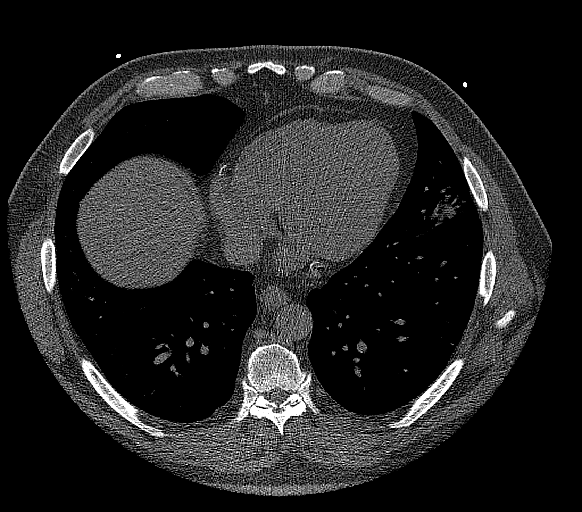
[im 40/80  vessel]
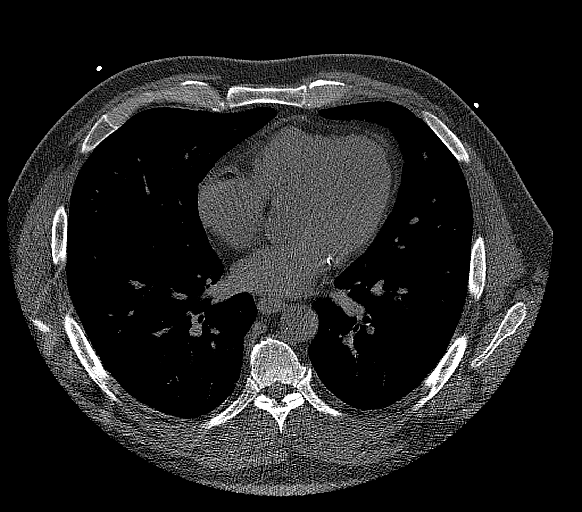
[im 53/80  vessel]
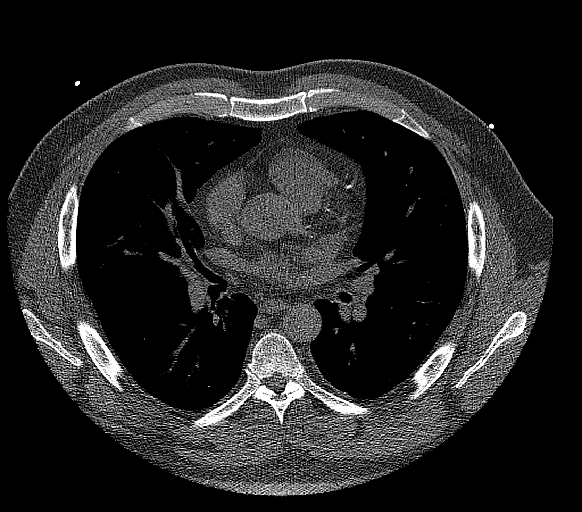
[im 66/80  vessel]
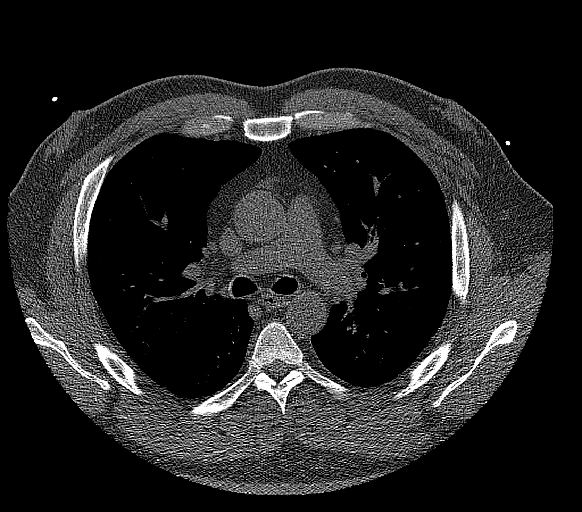

[14 of 20 positions shown; findings below may reference images not displayed]

FINDINGS: CORONARY CALCIUM SCORES:

Left Main: 0

LAD: 562

LCx: 539

RCA: 295

Total Agatston Score: 1,395

[HOSPITAL] percentile: 99th

AORTA MEASUREMENTS:

Ascending Aorta: 34 mm

Descending Aorta: 28 mm

OTHER FINDINGS:

Cardiovascular: Aortic atherosclerosis. Borderline cardiomegaly,
without pericardial effusion.

Mediastinum/Nodes: No imaged thoracic adenopathy.

Lungs/Pleura: No pleural fluid.

A lingular nodule measures 5 mm on 40/9. Other smaller bilateral
pulmonary nodules are identified on series 9.

Lingular mixed soft tissue and ground-glass density including at
x 3.1 cm on 53/19.

Upper Abdomen: Mild hepatic steatosis. Normal imaged portions of the
Spleen, stomach.

Musculoskeletal: Moderate bilateral gynecomastia.
IMPRESSION: 1. Total Agatston score of [DATE], corresponding to 99th percentile
for age, sex, and race based cohort.
2. Lingular mixed ground-glass and soft tissue density area could
represent a focus of infection or a mixed attenuation pulmonary
nodule. Correlate with infectious symptoms. Consider antibiotic
therapy prior to CT follow-up at 6-12 weeks.
3. Bilateral small soft tissue density pulmonary nodules which could
be re-evaluated at follow-up.
4.  Aortic Atherosclerosis ([id number].X).
5. Hepatic steatosis.
6. Bilateral gynecomastia.

These results will be called to the ordering clinician or
representative by the Radiologist Assistant, and communication
documented in the PACS or zVision Dashboard.

## 2020-11-23 ENCOUNTER — Other Ambulatory Visit (HOSPITAL_BASED_OUTPATIENT_CLINIC_OR_DEPARTMENT_OTHER): Payer: Self-pay

## 2020-11-23 ENCOUNTER — Other Ambulatory Visit: Payer: Self-pay

## 2020-11-24 ENCOUNTER — Other Ambulatory Visit (HOSPITAL_BASED_OUTPATIENT_CLINIC_OR_DEPARTMENT_OTHER): Payer: Self-pay

## 2020-11-24 MED ORDER — TRULICITY 3 MG/0.5ML ~~LOC~~ SOAJ
SUBCUTANEOUS | 12 refills | Status: DC
  Filled 2020-11-24 – 2020-12-02 (×2): qty 2, 28d supply, fill #0
  Filled 2021-01-01: qty 2, 28d supply, fill #1
  Filled 2021-02-15: qty 2, 28d supply, fill #2
  Filled 2021-04-08: qty 2, 28d supply, fill #3
  Filled 2021-05-14: qty 2, 28d supply, fill #4
  Filled 2021-07-16: qty 2, 28d supply, fill #5
  Filled 2021-09-06: qty 2, 28d supply, fill #6
  Filled 2021-10-12: qty 2, 28d supply, fill #7

## 2020-11-30 ENCOUNTER — Other Ambulatory Visit (HOSPITAL_BASED_OUTPATIENT_CLINIC_OR_DEPARTMENT_OTHER): Payer: Self-pay

## 2020-12-01 ENCOUNTER — Other Ambulatory Visit (HOSPITAL_BASED_OUTPATIENT_CLINIC_OR_DEPARTMENT_OTHER): Payer: Self-pay

## 2020-12-02 ENCOUNTER — Other Ambulatory Visit (HOSPITAL_BASED_OUTPATIENT_CLINIC_OR_DEPARTMENT_OTHER): Payer: Self-pay

## 2021-01-01 ENCOUNTER — Other Ambulatory Visit (HOSPITAL_BASED_OUTPATIENT_CLINIC_OR_DEPARTMENT_OTHER): Payer: Self-pay

## 2021-01-01 MED FILL — Rosuvastatin Calcium Tab 20 MG: ORAL | 90 days supply | Qty: 90 | Fill #0 | Status: AC

## 2021-01-01 MED FILL — Valsartan Tab 320 MG: ORAL | 90 days supply | Qty: 90 | Fill #0 | Status: AC

## 2021-01-01 MED FILL — Fenofibrate Tab 145 MG: ORAL | 90 days supply | Qty: 90 | Fill #0 | Status: AC

## 2021-02-08 ENCOUNTER — Other Ambulatory Visit (HOSPITAL_BASED_OUTPATIENT_CLINIC_OR_DEPARTMENT_OTHER): Payer: Self-pay

## 2021-02-08 DIAGNOSIS — E119 Type 2 diabetes mellitus without complications: Secondary | ICD-10-CM | POA: Diagnosis not present

## 2021-02-08 DIAGNOSIS — E1140 Type 2 diabetes mellitus with diabetic neuropathy, unspecified: Secondary | ICD-10-CM | POA: Diagnosis not present

## 2021-02-08 DIAGNOSIS — H524 Presbyopia: Secondary | ICD-10-CM | POA: Diagnosis not present

## 2021-02-08 DIAGNOSIS — H2513 Age-related nuclear cataract, bilateral: Secondary | ICD-10-CM | POA: Diagnosis not present

## 2021-02-08 DIAGNOSIS — E785 Hyperlipidemia, unspecified: Secondary | ICD-10-CM | POA: Diagnosis not present

## 2021-02-08 DIAGNOSIS — Z6835 Body mass index (BMI) 35.0-35.9, adult: Secondary | ICD-10-CM | POA: Diagnosis not present

## 2021-02-08 DIAGNOSIS — H5213 Myopia, bilateral: Secondary | ICD-10-CM | POA: Diagnosis not present

## 2021-02-08 DIAGNOSIS — I700 Atherosclerosis of aorta: Secondary | ICD-10-CM | POA: Diagnosis not present

## 2021-02-08 DIAGNOSIS — I10 Essential (primary) hypertension: Secondary | ICD-10-CM | POA: Diagnosis not present

## 2021-02-08 DIAGNOSIS — Z23 Encounter for immunization: Secondary | ICD-10-CM | POA: Diagnosis not present

## 2021-02-08 DIAGNOSIS — H52223 Regular astigmatism, bilateral: Secondary | ICD-10-CM | POA: Diagnosis not present

## 2021-02-08 DIAGNOSIS — G4733 Obstructive sleep apnea (adult) (pediatric): Secondary | ICD-10-CM | POA: Diagnosis not present

## 2021-02-08 DIAGNOSIS — I2510 Atherosclerotic heart disease of native coronary artery without angina pectoris: Secondary | ICD-10-CM | POA: Diagnosis not present

## 2021-02-08 MED ORDER — JARDIANCE 25 MG PO TABS
ORAL_TABLET | ORAL | 4 refills | Status: AC
  Filled 2021-02-08: qty 90, 90d supply, fill #0
  Filled 2021-08-10: qty 90, 90d supply, fill #1

## 2021-02-08 MED ORDER — METFORMIN HCL 1000 MG PO TABS
ORAL_TABLET | ORAL | 4 refills | Status: DC
  Filled 2021-02-08: qty 90, 90d supply, fill #0
  Filled 2021-08-10: qty 90, 90d supply, fill #1
  Filled 2022-01-25: qty 90, 90d supply, fill #2

## 2021-02-15 ENCOUNTER — Other Ambulatory Visit (HOSPITAL_BASED_OUTPATIENT_CLINIC_OR_DEPARTMENT_OTHER): Payer: Self-pay

## 2021-02-17 ENCOUNTER — Other Ambulatory Visit (HOSPITAL_BASED_OUTPATIENT_CLINIC_OR_DEPARTMENT_OTHER): Payer: Self-pay

## 2021-02-17 DIAGNOSIS — J0100 Acute maxillary sinusitis, unspecified: Secondary | ICD-10-CM | POA: Diagnosis not present

## 2021-02-17 MED ORDER — AMOXICILLIN-POT CLAVULANATE 875-125 MG PO TABS
ORAL_TABLET | ORAL | 0 refills | Status: DC
  Filled 2021-02-17: qty 10, 5d supply, fill #0

## 2021-04-08 ENCOUNTER — Other Ambulatory Visit (HOSPITAL_BASED_OUTPATIENT_CLINIC_OR_DEPARTMENT_OTHER): Payer: Self-pay

## 2021-05-14 ENCOUNTER — Other Ambulatory Visit (HOSPITAL_BASED_OUTPATIENT_CLINIC_OR_DEPARTMENT_OTHER): Payer: Self-pay

## 2021-05-14 DIAGNOSIS — I10 Essential (primary) hypertension: Secondary | ICD-10-CM | POA: Diagnosis not present

## 2021-05-14 DIAGNOSIS — E1165 Type 2 diabetes mellitus with hyperglycemia: Secondary | ICD-10-CM | POA: Diagnosis not present

## 2021-05-14 DIAGNOSIS — I2510 Atherosclerotic heart disease of native coronary artery without angina pectoris: Secondary | ICD-10-CM | POA: Diagnosis not present

## 2021-05-14 DIAGNOSIS — I700 Atherosclerosis of aorta: Secondary | ICD-10-CM | POA: Diagnosis not present

## 2021-05-14 DIAGNOSIS — Z6835 Body mass index (BMI) 35.0-35.9, adult: Secondary | ICD-10-CM | POA: Diagnosis not present

## 2021-05-14 DIAGNOSIS — E785 Hyperlipidemia, unspecified: Secondary | ICD-10-CM | POA: Diagnosis not present

## 2021-05-14 MED ORDER — VALSARTAN-HYDROCHLOROTHIAZIDE 320-12.5 MG PO TABS
ORAL_TABLET | ORAL | 4 refills | Status: DC
  Filled 2021-05-14: qty 90, 90d supply, fill #0
  Filled 2022-04-12: qty 90, 90d supply, fill #1

## 2021-07-16 ENCOUNTER — Other Ambulatory Visit (HOSPITAL_BASED_OUTPATIENT_CLINIC_OR_DEPARTMENT_OTHER): Payer: Self-pay

## 2021-08-10 ENCOUNTER — Other Ambulatory Visit (HOSPITAL_BASED_OUTPATIENT_CLINIC_OR_DEPARTMENT_OTHER): Payer: Self-pay

## 2021-09-06 ENCOUNTER — Other Ambulatory Visit (HOSPITAL_BASED_OUTPATIENT_CLINIC_OR_DEPARTMENT_OTHER): Payer: Self-pay

## 2021-09-29 ENCOUNTER — Other Ambulatory Visit (HOSPITAL_BASED_OUTPATIENT_CLINIC_OR_DEPARTMENT_OTHER): Payer: Self-pay

## 2021-09-29 ENCOUNTER — Other Ambulatory Visit: Payer: Self-pay | Admitting: Cardiovascular Disease

## 2021-09-29 DIAGNOSIS — E785 Hyperlipidemia, unspecified: Secondary | ICD-10-CM

## 2021-09-29 DIAGNOSIS — E119 Type 2 diabetes mellitus without complications: Secondary | ICD-10-CM

## 2021-09-29 DIAGNOSIS — I10 Essential (primary) hypertension: Secondary | ICD-10-CM

## 2021-09-29 MED ORDER — ROSUVASTATIN CALCIUM 20 MG PO TABS
20.0000 mg | ORAL_TABLET | Freq: Every day | ORAL | 0 refills | Status: DC
  Filled 2021-09-29: qty 30, 30d supply, fill #0

## 2021-09-30 ENCOUNTER — Other Ambulatory Visit (HOSPITAL_BASED_OUTPATIENT_CLINIC_OR_DEPARTMENT_OTHER): Payer: Self-pay

## 2021-10-12 ENCOUNTER — Other Ambulatory Visit (HOSPITAL_BASED_OUTPATIENT_CLINIC_OR_DEPARTMENT_OTHER): Payer: Self-pay

## 2021-10-15 ENCOUNTER — Other Ambulatory Visit (HOSPITAL_COMMUNITY): Payer: Self-pay

## 2021-10-15 ENCOUNTER — Other Ambulatory Visit: Payer: Self-pay | Admitting: Cardiovascular Disease

## 2021-10-15 DIAGNOSIS — E119 Type 2 diabetes mellitus without complications: Secondary | ICD-10-CM

## 2021-10-15 DIAGNOSIS — I10 Essential (primary) hypertension: Secondary | ICD-10-CM

## 2021-10-15 DIAGNOSIS — E785 Hyperlipidemia, unspecified: Secondary | ICD-10-CM

## 2021-10-15 MED ORDER — FENOFIBRATE 145 MG PO TABS
145.0000 mg | ORAL_TABLET | Freq: Every day | ORAL | 0 refills | Status: DC
  Filled 2021-10-15 – 2021-10-22 (×2): qty 30, 30d supply, fill #0

## 2021-10-22 ENCOUNTER — Other Ambulatory Visit (HOSPITAL_BASED_OUTPATIENT_CLINIC_OR_DEPARTMENT_OTHER): Payer: Self-pay

## 2021-10-25 ENCOUNTER — Other Ambulatory Visit (HOSPITAL_COMMUNITY): Payer: Self-pay

## 2021-12-12 ENCOUNTER — Other Ambulatory Visit (HOSPITAL_BASED_OUTPATIENT_CLINIC_OR_DEPARTMENT_OTHER): Payer: Self-pay

## 2021-12-13 ENCOUNTER — Other Ambulatory Visit (HOSPITAL_BASED_OUTPATIENT_CLINIC_OR_DEPARTMENT_OTHER): Payer: Self-pay

## 2021-12-13 MED ORDER — TRULICITY 3 MG/0.5ML ~~LOC~~ SOPN
PEN_INJECTOR | SUBCUTANEOUS | 12 refills | Status: AC
Start: 2021-12-13 — End: ?
  Filled 2021-12-13: qty 2, 28d supply, fill #0
  Filled 2022-02-08: qty 2, 28d supply, fill #1
  Filled 2022-03-15: qty 2, 28d supply, fill #2
  Filled 2022-04-12: qty 2, 28d supply, fill #3

## 2021-12-31 ENCOUNTER — Other Ambulatory Visit: Payer: Self-pay | Admitting: Cardiovascular Disease

## 2021-12-31 DIAGNOSIS — E119 Type 2 diabetes mellitus without complications: Secondary | ICD-10-CM

## 2021-12-31 DIAGNOSIS — I10 Essential (primary) hypertension: Secondary | ICD-10-CM

## 2021-12-31 DIAGNOSIS — E785 Hyperlipidemia, unspecified: Secondary | ICD-10-CM

## 2022-01-03 ENCOUNTER — Other Ambulatory Visit (HOSPITAL_BASED_OUTPATIENT_CLINIC_OR_DEPARTMENT_OTHER): Payer: Self-pay

## 2022-01-03 MED ORDER — ROSUVASTATIN CALCIUM 20 MG PO TABS
20.0000 mg | ORAL_TABLET | Freq: Every day | ORAL | 0 refills | Status: DC
  Filled 2022-01-03: qty 15, 15d supply, fill #0

## 2022-01-25 ENCOUNTER — Other Ambulatory Visit: Payer: Self-pay | Admitting: Cardiovascular Disease

## 2022-01-25 ENCOUNTER — Other Ambulatory Visit (HOSPITAL_COMMUNITY): Payer: Self-pay

## 2022-01-25 ENCOUNTER — Other Ambulatory Visit (HOSPITAL_BASED_OUTPATIENT_CLINIC_OR_DEPARTMENT_OTHER): Payer: Self-pay

## 2022-01-25 DIAGNOSIS — E785 Hyperlipidemia, unspecified: Secondary | ICD-10-CM

## 2022-01-25 DIAGNOSIS — I10 Essential (primary) hypertension: Secondary | ICD-10-CM

## 2022-01-25 DIAGNOSIS — E119 Type 2 diabetes mellitus without complications: Secondary | ICD-10-CM

## 2022-01-25 MED ORDER — ROSUVASTATIN CALCIUM 20 MG PO TABS
20.0000 mg | ORAL_TABLET | Freq: Every day | ORAL | 0 refills | Status: DC
  Filled 2022-01-25: qty 15, 15d supply, fill #0

## 2022-01-25 MED ORDER — FENOFIBRATE 145 MG PO TABS
145.0000 mg | ORAL_TABLET | Freq: Every day | ORAL | 0 refills | Status: DC
  Filled 2022-01-25: qty 15, 15d supply, fill #0

## 2022-01-25 NOTE — Telephone Encounter (Signed)
Rx refill sent to pharmacy. 

## 2022-01-26 ENCOUNTER — Other Ambulatory Visit (HOSPITAL_COMMUNITY): Payer: Self-pay

## 2022-02-07 ENCOUNTER — Emergency Department (HOSPITAL_COMMUNITY): Payer: 59

## 2022-02-07 ENCOUNTER — Emergency Department (HOSPITAL_COMMUNITY)
Admission: EM | Admit: 2022-02-07 | Discharge: 2022-02-08 | Discharge status: Against Medical Advice | Payer: 59 | Attending: Emergency Medicine | Admitting: Emergency Medicine

## 2022-02-07 ENCOUNTER — Encounter (HOSPITAL_COMMUNITY): Payer: Self-pay | Admitting: Emergency Medicine

## 2022-02-07 ENCOUNTER — Other Ambulatory Visit: Payer: Self-pay

## 2022-02-07 DIAGNOSIS — I6381 Other cerebral infarction due to occlusion or stenosis of small artery: Secondary | ICD-10-CM | POA: Diagnosis not present

## 2022-02-07 DIAGNOSIS — R200 Anesthesia of skin: Secondary | ICD-10-CM | POA: Diagnosis not present

## 2022-02-07 DIAGNOSIS — R202 Paresthesia of skin: Secondary | ICD-10-CM | POA: Diagnosis not present

## 2022-02-07 DIAGNOSIS — Z5321 Procedure and treatment not carried out due to patient leaving prior to being seen by health care provider: Secondary | ICD-10-CM | POA: Insufficient documentation

## 2022-02-07 DIAGNOSIS — I639 Cerebral infarction, unspecified: Secondary | ICD-10-CM | POA: Diagnosis not present

## 2022-02-07 LAB — CBC
HCT: 47.5 % (ref 39.0–52.0)
Hemoglobin: 17.1 g/dL — ABNORMAL HIGH (ref 13.0–17.0)
MCH: 30.5 pg (ref 26.0–34.0)
MCHC: 36 g/dL (ref 30.0–36.0)
MCV: 84.8 fL (ref 80.0–100.0)
Platelets: 153 10*3/uL (ref 150–400)
RBC: 5.6 MIL/uL (ref 4.22–5.81)
RDW: 12.7 % (ref 11.5–15.5)
WBC: 7 10*3/uL (ref 4.0–10.5)
nRBC: 0 % (ref 0.0–0.2)

## 2022-02-07 LAB — I-STAT CHEM 8, ED
BUN: 26 mg/dL — ABNORMAL HIGH (ref 6–20)
Calcium, Ion: 1.16 mmol/L (ref 1.15–1.40)
Chloride: 102 mmol/L (ref 98–111)
Creatinine, Ser: 1.3 mg/dL — ABNORMAL HIGH (ref 0.61–1.24)
Glucose, Bld: 233 mg/dL — ABNORMAL HIGH (ref 70–99)
HCT: 47 % (ref 39.0–52.0)
Hemoglobin: 16 g/dL (ref 13.0–17.0)
Potassium: 3.9 mmol/L (ref 3.5–5.1)
Sodium: 137 mmol/L (ref 135–145)
TCO2: 22 mmol/L (ref 22–32)

## 2022-02-07 LAB — COMPREHENSIVE METABOLIC PANEL
ALT: 32 U/L (ref 0–44)
AST: 25 U/L (ref 15–41)
Albumin: 4.1 g/dL (ref 3.5–5.0)
Alkaline Phosphatase: 71 U/L (ref 38–126)
Anion gap: 11 (ref 5–15)
BUN: 23 mg/dL — ABNORMAL HIGH (ref 6–20)
CO2: 22 mmol/L (ref 22–32)
Calcium: 9.9 mg/dL (ref 8.9–10.3)
Chloride: 102 mmol/L (ref 98–111)
Creatinine, Ser: 1.3 mg/dL — ABNORMAL HIGH (ref 0.61–1.24)
GFR, Estimated: >60 mL/min (ref >60)
Glucose, Bld: 239 mg/dL — ABNORMAL HIGH (ref 70–99)
Potassium: 4 mmol/L (ref 3.5–5.1)
Sodium: 135 mmol/L (ref 135–145)
Total Bilirubin: 1.3 mg/dL — ABNORMAL HIGH (ref 0.3–1.2)
Total Protein: 7 g/dL (ref 6.5–8.1)

## 2022-02-07 LAB — DIFFERENTIAL
Abs Immature Granulocytes: 0.02 10*3/uL (ref 0.00–0.07)
Basophils Absolute: 0 10*3/uL (ref 0.0–0.1)
Basophils Relative: 1 %
Eosinophils Absolute: 0.1 10*3/uL (ref 0.0–0.5)
Eosinophils Relative: 2 %
Immature Granulocytes: 0 %
Lymphocytes Relative: 30 %
Lymphs Abs: 2.1 10*3/uL (ref 0.7–4.0)
Monocytes Absolute: 0.7 10*3/uL (ref 0.1–1.0)
Monocytes Relative: 9 %
Neutro Abs: 4.1 10*3/uL (ref 1.7–7.7)
Neutrophils Relative %: 58 %

## 2022-02-07 LAB — CBG MONITORING, ED: Glucose-Capillary: 252 mg/dL — ABNORMAL HIGH (ref 70–99)

## 2022-02-07 LAB — PROTIME-INR
INR: 1 (ref 0.8–1.2)
Prothrombin Time: 13.2 seconds (ref 11.4–15.2)

## 2022-02-07 LAB — APTT: aPTT: 26 seconds (ref 24–36)

## 2022-02-07 NOTE — ED Triage Notes (Signed)
Pt reports left sided numbness since yesterday

## 2022-02-07 NOTE — ED Provider Triage Note (Signed)
Emergency Medicine Provider Triage Evaluation Note  Wayne Ho , a 51 y.o. male  was evaluated in triage.  Pt complains of left-sided facial, upper extremity, flank and lower extremity numbness.  Patient states that symptoms began around 630 yesterday morning with left-sided facial numbness which then progressed to other areas mentioned.  Denies any weakness, visual deficits, facial droop, slurred speech, gait abnormalities.  Denies any history of CVA/TIA.  Denies blood thinner/antiplatelet use.  Denies any known trauma or similar symptoms in the past..  Review of Systems  Positive: See above Negative:   Physical Exam  BP (!) 158/102 (BP Location: Left Arm)   Pulse 82   Temp 98.5 F (36.9 C) (Oral)   Resp 18   Ht 6\' 3"  (1.905 m)   Wt 125.2 kg   SpO2 98%   BMI 34.50 kg/m  Gen:   Awake, no distress   Resp:  Normal effort  MSK:   Moves extremities without difficulty  Other:  Left-sided facial, upper extremity, lower extremity numbness.  Motor strength 5 out of 5 upper and lower extremities.  PERRLA bilaterally.  Cranial 3 through 12 grossly intact bilaterally.  Medical Decision Making  Medically screening exam initiated at 9:42 PM.  Appropriate orders placed.  Wayne Ho was informed that the remainder of the evaluation will be completed by another provider, this initial triage assessment does not replace that evaluation, and the importance of remaining in the ED until their evaluation is complete.    Wayne Ho, [place] 02/07/22 2143

## 2022-02-08 ENCOUNTER — Other Ambulatory Visit (HOSPITAL_BASED_OUTPATIENT_CLINIC_OR_DEPARTMENT_OTHER): Payer: Self-pay

## 2022-02-08 ENCOUNTER — Emergency Department (HOSPITAL_COMMUNITY): Payer: 59

## 2022-02-08 DIAGNOSIS — I639 Cerebral infarction, unspecified: Secondary | ICD-10-CM | POA: Diagnosis not present

## 2022-02-08 DIAGNOSIS — R200 Anesthesia of skin: Secondary | ICD-10-CM | POA: Diagnosis not present

## 2022-02-08 DIAGNOSIS — Z5321 Procedure and treatment not carried out due to patient leaving prior to being seen by health care provider: Secondary | ICD-10-CM | POA: Diagnosis not present

## 2022-02-08 NOTE — ED Notes (Signed)
Patient states the wait is too long and he is leaving 

## 2022-02-09 ENCOUNTER — Observation Stay (HOSPITAL_COMMUNITY)
Admission: EM | Admit: 2022-02-09 | Discharge: 2022-02-10 | Disposition: A | Discharge status: Home / Self Care | Payer: 59 | Attending: Internal Medicine | Admitting: Internal Medicine

## 2022-02-09 ENCOUNTER — Telehealth: Payer: Self-pay | Admitting: Cardiovascular Disease

## 2022-02-09 ENCOUNTER — Encounter (HOSPITAL_COMMUNITY): Payer: Self-pay | Admitting: Emergency Medicine

## 2022-02-09 ENCOUNTER — Observation Stay (HOSPITAL_COMMUNITY): Payer: 59

## 2022-02-09 ENCOUNTER — Ambulatory Visit: Payer: 59 | Admitting: Physician Assistant

## 2022-02-09 DIAGNOSIS — Z7982 Long term (current) use of aspirin: Secondary | ICD-10-CM | POA: Insufficient documentation

## 2022-02-09 DIAGNOSIS — E669 Obesity, unspecified: Secondary | ICD-10-CM | POA: Diagnosis present

## 2022-02-09 DIAGNOSIS — M47812 Spondylosis without myelopathy or radiculopathy, cervical region: Secondary | ICD-10-CM | POA: Diagnosis not present

## 2022-02-09 DIAGNOSIS — Z7984 Long term (current) use of oral hypoglycemic drugs: Secondary | ICD-10-CM | POA: Insufficient documentation

## 2022-02-09 DIAGNOSIS — K219 Gastro-esophageal reflux disease without esophagitis: Secondary | ICD-10-CM | POA: Diagnosis present

## 2022-02-09 DIAGNOSIS — I639 Cerebral infarction, unspecified: Secondary | ICD-10-CM | POA: Diagnosis present

## 2022-02-09 DIAGNOSIS — Z79899 Other long term (current) drug therapy: Secondary | ICD-10-CM | POA: Insufficient documentation

## 2022-02-09 DIAGNOSIS — E785 Hyperlipidemia, unspecified: Secondary | ICD-10-CM | POA: Diagnosis present

## 2022-02-09 DIAGNOSIS — I6381 Other cerebral infarction due to occlusion or stenosis of small artery: Secondary | ICD-10-CM | POA: Diagnosis not present

## 2022-02-09 DIAGNOSIS — E1165 Type 2 diabetes mellitus with hyperglycemia: Secondary | ICD-10-CM | POA: Diagnosis not present

## 2022-02-09 DIAGNOSIS — Z7985 Long-term (current) use of injectable non-insulin antidiabetic drugs: Secondary | ICD-10-CM | POA: Insufficient documentation

## 2022-02-09 DIAGNOSIS — I2510 Atherosclerotic heart disease of native coronary artery without angina pectoris: Secondary | ICD-10-CM | POA: Diagnosis present

## 2022-02-09 DIAGNOSIS — M4802 Spinal stenosis, cervical region: Secondary | ICD-10-CM | POA: Diagnosis not present

## 2022-02-09 DIAGNOSIS — I6523 Occlusion and stenosis of bilateral carotid arteries: Secondary | ICD-10-CM | POA: Diagnosis not present

## 2022-02-09 DIAGNOSIS — R200 Anesthesia of skin: Secondary | ICD-10-CM | POA: Diagnosis present

## 2022-02-09 DIAGNOSIS — I10 Essential (primary) hypertension: Secondary | ICD-10-CM | POA: Insufficient documentation

## 2022-02-09 DIAGNOSIS — G4733 Obstructive sleep apnea (adult) (pediatric): Secondary | ICD-10-CM | POA: Diagnosis present

## 2022-02-09 DIAGNOSIS — I672 Cerebral atherosclerosis: Secondary | ICD-10-CM | POA: Diagnosis not present

## 2022-02-09 DIAGNOSIS — E119 Type 2 diabetes mellitus without complications: Secondary | ICD-10-CM

## 2022-02-09 DIAGNOSIS — Z6834 Body mass index (BMI) 34.0-34.9, adult: Secondary | ICD-10-CM | POA: Diagnosis not present

## 2022-02-09 DIAGNOSIS — I6503 Occlusion and stenosis of bilateral vertebral arteries: Secondary | ICD-10-CM | POA: Diagnosis not present

## 2022-02-09 HISTORY — DX: Cerebral infarction, unspecified: I63.9

## 2022-02-09 LAB — CBC WITH DIFFERENTIAL/PLATELET
Abs Immature Granulocytes: 0.02 10*3/uL (ref 0.00–0.07)
Basophils Absolute: 0 10*3/uL (ref 0.0–0.1)
Basophils Relative: 1 %
Eosinophils Absolute: 0.1 10*3/uL (ref 0.0–0.5)
Eosinophils Relative: 1 %
HCT: 47.3 % (ref 39.0–52.0)
Hemoglobin: 16.8 g/dL (ref 13.0–17.0)
Immature Granulocytes: 0 %
Lymphocytes Relative: 28 %
Lymphs Abs: 1.8 10*3/uL (ref 0.7–4.0)
MCH: 30.9 pg (ref 26.0–34.0)
MCHC: 35.5 g/dL (ref 30.0–36.0)
MCV: 87.1 fL (ref 80.0–100.0)
Monocytes Absolute: 0.5 10*3/uL (ref 0.1–1.0)
Monocytes Relative: 8 %
Neutro Abs: 3.9 10*3/uL (ref 1.7–7.7)
Neutrophils Relative %: 62 %
Platelets: 149 10*3/uL — ABNORMAL LOW (ref 150–400)
RBC: 5.43 MIL/uL (ref 4.22–5.81)
RDW: 13 % (ref 11.5–15.5)
WBC: 6.3 10*3/uL (ref 4.0–10.5)
nRBC: 0 % (ref 0.0–0.2)

## 2022-02-09 LAB — BASIC METABOLIC PANEL
Anion gap: 11 (ref 5–15)
BUN: 22 mg/dL — ABNORMAL HIGH (ref 6–20)
CO2: 24 mmol/L (ref 22–32)
Calcium: 10.2 mg/dL (ref 8.9–10.3)
Chloride: 106 mmol/L (ref 98–111)
Creatinine, Ser: 1.09 mg/dL (ref 0.61–1.24)
GFR, Estimated: >60 mL/min (ref >60)
Glucose, Bld: 222 mg/dL — ABNORMAL HIGH (ref 70–99)
Potassium: 4 mmol/L (ref 3.5–5.1)
Sodium: 141 mmol/L (ref 135–145)

## 2022-02-09 LAB — CBG MONITORING, ED: Glucose-Capillary: 143 mg/dL — ABNORMAL HIGH (ref 70–99)

## 2022-02-09 LAB — HEMOGLOBIN A1C
Hgb A1c MFr Bld: 7.6 % — ABNORMAL HIGH (ref 4.8–5.6)
Mean Plasma Glucose: 171.42 mg/dL

## 2022-02-09 MED ORDER — ATORVASTATIN CALCIUM 40 MG PO TABS: 80.0000 mg | ORAL_TABLET | Freq: Every day | ORAL | Status: DC

## 2022-02-09 MED ORDER — ACETAMINOPHEN 325 MG PO TABS: 650.0000 mg | ORAL_TABLET | ORAL | Status: DC | PRN

## 2022-02-09 MED ORDER — IOHEXOL 350 MG/ML SOLN
60.0000 mL | Freq: Once | INTRAVENOUS | Status: AC | PRN
  Administered 2022-02-09: 60 mL via INTRAVENOUS

## 2022-02-09 MED ORDER — ENOXAPARIN SODIUM 40 MG/0.4ML IJ SOSY
40.0000 mg | PREFILLED_SYRINGE | INTRAMUSCULAR | Status: DC
  Administered 2022-02-10: 40 mg via SUBCUTANEOUS
  Filled 2022-02-09: qty 0.4

## 2022-02-09 MED ORDER — HYDROCHLOROTHIAZIDE 12.5 MG PO TABS
12.5000 mg | ORAL_TABLET | Freq: Every day | ORAL | Status: DC
  Administered 2022-02-10: 12.5 mg via ORAL
  Filled 2022-02-09: qty 1

## 2022-02-09 MED ORDER — INSULIN ASPART 100 UNIT/ML IJ SOLN
0.0000 [IU] | Freq: Three times a day (TID) | INTRAMUSCULAR | Status: DC
  Administered 2022-02-10: 2 [IU] via SUBCUTANEOUS

## 2022-02-09 MED ORDER — ASPIRIN 300 MG RE SUPP: 300.0000 mg | Freq: Every day | RECTAL | Status: DC

## 2022-02-09 MED ORDER — ASPIRIN 325 MG PO TABS: 325.0000 mg | ORAL_TABLET | Freq: Every day | ORAL | Status: DC

## 2022-02-09 MED ORDER — ROSUVASTATIN CALCIUM 20 MG PO TABS
40.0000 mg | ORAL_TABLET | Freq: Every day | ORAL | Status: DC
  Administered 2022-02-10: 40 mg via ORAL
  Filled 2022-02-09: qty 2

## 2022-02-09 MED ORDER — ACETAMINOPHEN 160 MG/5ML PO SOLN: 650.0000 mg | ORAL | Status: DC | PRN

## 2022-02-09 MED ORDER — FENOFIBRATE 160 MG PO TABS
160.0000 mg | ORAL_TABLET | Freq: Every day | ORAL | Status: DC
  Filled 2022-02-09: qty 1

## 2022-02-09 MED ORDER — PANTOPRAZOLE SODIUM 40 MG PO TBEC
40.0000 mg | DELAYED_RELEASE_TABLET | Freq: Every day | ORAL | Status: DC
  Administered 2022-02-10: 40 mg via ORAL
  Filled 2022-02-09: qty 1

## 2022-02-09 MED ORDER — ASPIRIN 81 MG PO TBEC
81.0000 mg | DELAYED_RELEASE_TABLET | Freq: Every day | ORAL | Status: DC
  Administered 2022-02-09 – 2022-02-10 (×2): 81 mg via ORAL
  Filled 2022-02-09 (×2): qty 1

## 2022-02-09 MED ORDER — ACETAMINOPHEN 650 MG RE SUPP: 650.0000 mg | RECTAL | Status: DC | PRN

## 2022-02-09 MED ORDER — STROKE: EARLY STAGES OF RECOVERY BOOK
Freq: Once | Status: AC
  Filled 2022-02-09: qty 1

## 2022-02-09 MED ORDER — EMPAGLIFLOZIN 25 MG PO TABS
25.0000 mg | ORAL_TABLET | Freq: Every day | ORAL | Status: DC
  Filled 2022-02-09: qty 1

## 2022-02-09 MED ORDER — INSULIN ASPART 100 UNIT/ML IJ SOLN: 0.0000 [IU] | Freq: Every day | INTRAMUSCULAR | Status: DC

## 2022-02-09 MED ORDER — IRBESARTAN 300 MG PO TABS
300.0000 mg | ORAL_TABLET | Freq: Every day | ORAL | Status: DC
  Administered 2022-02-10: 300 mg via ORAL
  Filled 2022-02-09: qty 1

## 2022-02-09 MED ORDER — VALSARTAN-HYDROCHLOROTHIAZIDE 320-12.5 MG PO TABS: 1.0000 | ORAL_TABLET | Freq: Every day | ORAL | Status: DC

## 2022-02-09 MED ORDER — CLOPIDOGREL BISULFATE 75 MG PO TABS
75.0000 mg | ORAL_TABLET | Freq: Every day | ORAL | Status: DC
  Administered 2022-02-09 – 2022-02-10 (×2): 75 mg via ORAL
  Filled 2022-02-09 (×2): qty 1

## 2022-02-09 NOTE — Telephone Encounter (Signed)
Patient was originally put on [employer] schedule for today with c/o left arm numbness. Upon review of the patient's chart Selinda Eon noticed that the patient was in the ER last night and left AMA due to long wait times. Upon further review patient had an MRI that showed:  IMPRESSION: 1. Small acute infarct of the right thalamus. No hemorrhage or mass effect. 2. Findings of chronic small vessel ischemia.  We attempted to reach out to the ER charge nurse to see if they had already reached out to the patient, but was unsuccessful in this attempt.  We called the patient and made him aware of his results and let him know that we were reaching out to neuro for review/recommendation and perhaps a same day appt with them instead of cardiology. Let patient know that his appt was cancelled with cardiology and that he may need to go back to the ER.  We were able to call over to neurology and have Dr. Leonie Man review the patient's MRI and appointment was given with him tomorrow at 8:00 AM and instructions for patient to go to the ER if he needs to be seen sooner.   Patient now calling us back now and is still having significant stroke Sx and has been advised to go back to the ER. Patient states that he is on his way back there now.

## 2022-02-09 NOTE — Telephone Encounter (Signed)
Called and spoke to patient. He is currently at the hospital and are planning to admit him. Made patient aware that if they admit him that they will arrange necessary post hospital f/u.

## 2022-02-09 NOTE — H&P (Signed)
History and Physical  Patient: Wayne Ho. QIH:[id number] DOB: [birth year] DOA: 02/09/2022 DOS: the patient was seen and examined on 02/09/2022 Patient coming from: Home  Chief Complaint:  Chief Complaint  Patient presents with   Numbness   HPI: Wayne Ho. is a 51 y.o. male with PMH significant of type II DM, GERD, obesity, OSA, HLD present to the hospital with complaints of left-sided numbness.  Patient initially presented To ED with complaints of left-sided numbness on 10/2. Symptom onset has been since 10/1. CT of the head was unremarkable for any acute abnormality. An MRI of the brain was performed same visit was positive for acute stroke in right thalamic area.  patient left the ED and call the PCP for neurology referral. Patient was supposed to see cardiology in the clinic who restarted neurology and recommended the patient goes to ER for further evaluation. At the time of my interview patient continues to report left-sided numbness otherwise no other acute complaints of chest pain.  Shortness of breath.  No nausea no vomiting.  No fever no chills.  No diarrhea no constipation. Does not smoke or drink alcohol at his baseline. No drug abuse. Noncompliant with his CPAP but compliant with all of his other medications. No prior history of stroke or similar symptoms. He is adopted so he does not know his family history.  Review of Systems: As mentioned in the history of present illness. All other systems reviewed and are negative. Past Medical History:  Diagnosis Date   Chronic chest pain    DIABETES MELLITUS, TYPE II, CONTROLLED, MILD 07/27/2009   Dyslipidemia    ED (erectile dysfunction)    ESOPHAGITIS NEC 12/19/2006   GERD (gastroesophageal reflux disease)    Hypertension    Obesity    OBSTRUCTIVE SLEEP APNEA 11/02/2007   Past Surgical History:  Procedure Laterality Date   ANTERIOR CRUCIATE LIGAMENT REPAIR     both knees   CHOLECYSTECTOMY  11/07   Social History:   reports that he has never smoked. His smokeless tobacco use includes chew. He reports that he does not drink alcohol and does not use drugs. No Known Allergies Family History  Problem Relation Age of Onset   Hypertension Mother    Heart disease Father    Early death Father    Coronary artery disease Other    Prior to Admission medications   Medication Sig Start Date End Date Taking? Authorizing Provider  aspirin 81 MG tablet Take 81 mg by mouth daily.   Yes [provider]  Dulaglutide (TRULICITY) 3 MG/0.5ML SOPN Inject one pen under the skin once a week as directed 12/13/21  Yes   empagliflozin (JARDIANCE) 25 MG TABS tablet Take 1 tablet by mouth once a day 02/08/21  Yes   fenofibrate (TRICOR) 145 MG tablet Take 1 tablet (145 mg total) by mouth daily. (needs appointment) 01/25/22  Yes Nahser, Deloris Ping, MD  metFORMIN (GLUCOPHAGE) 1000 MG tablet Take 1 tablet (1,000 mg total) by mouth daily with supper. 01/25/16  Yes Shelva Majestic, MD  metFORMIN (GLUCOPHAGE) 1000 MG tablet Take 1 tablet by mouth once daily with a meal 02/08/21  Yes   omeprazole (PRILOSEC) 20 MG capsule Take 20 mg by mouth as needed (for heartburn/indigestion).   Yes [provider]  rosuvastatin (CRESTOR) 20 MG tablet Take 1 tablet (20 mg total) by mouth daily. 01/25/22  Yes Nahser, Deloris Ping, MD  sildenafil (VIAGRA) 100 MG tablet Take 0.5-1 tablets (50-100 mg total)  by mouth daily as needed for erectile dysfunction. 10/17/16  Yes Marin Olp, MD  TRULICITY 3 XL/2.4MW SOPN Inject 3 mg into the skin once a week. 12/31/19  Yes [provider]  valsartan-hydrochlorothiazide (DIOVAN-HCT) 320-12.5 MG tablet Take 1 tablet by mouth once daily 05/14/21  Yes   glucose blood test strip 1 each by Other route as needed. Use as instructed    [provider]   Physical Exam: Vitals:   02/09/22 1324 02/09/22 1602 02/09/22 1615 02/09/22 1830  BP: (!) 146/94 (!) 162/93 (!) 157/99 (!) 160/104  Pulse: 74 71  72 71  Resp: 17 (!) 23 (!) 21 10  Temp: 98.4 F (36.9 C) 98.7 F (37.1 C)    TempSrc: Oral Oral    SpO2: 100% 97% 98% 97%   General: Appear in mild distress; no visible Abnormal Neck Mass Or lumps, Conjunctiva normal Cardiovascular: S1 and S2 Present, no Murmur, Respiratory: good respiratory effort, Bilateral Air entry present and CTA, no Crackles, no wheezes Abdomen: Bowel Sound present, Non tender  Extremities: no Pedal edema Neurology: alert and oriented to time, place, and person Mental status AAOx3, speech normal, attention normal,  Cranial Nerves PERRL, EOM normal and present, facial sensation to light touch present, reduced on left side Motor strength bilateral equal strength 5/5,  Sensation present to light touch, reduced on the left side Cerebellar test normal finger nose finger. Gait not checked due to patient safety concerns   Data Reviewed: I have Reviewed nursing notes, Vitals, and Lab results since pt's last encounter. Pertinent lab results CBC and CMP I have ordered test including CBC, BMP, lipid panel, hemoglobin A1c I have independently visualized and interpreted imaging MRI brain and CT head which showed acute right thalamic infarct. I have independently visualized and interpreted EKG which showed EKG: normal sinus rhythm, nonspecific ST and T waves changes. I have discussed pt's care plan and test results with neurology.   Assessment and Plan Small acute right thalamic infarct. With residual left-sided numbness.  No significant weakness. PT OT consulted. Neurology consult. CTA head and neck ordered. Patient is on aspirin 81 mg, currently on aspirin and Plavix 81 mg for 3 weeks followed by aspirin alone. Check lipid panel. Check hemoglobin A1c. Check UDS. Patient noncompliant with CPAP at baseline, but agreeable to use CPAP tonight. No indication for permissive hypertension as the symptoms have been ongoing for more than 5 days. Echocardiogram  ordered. Further cardiac work-up pending stroke team evaluation.  Type 2 diabetes mellitus, uncontrolled with hyperglycemia, without long-term insulin use with HLD. Patient not aware of his A1c level. We will check hemoglobin A1c. Continue Jardiance. Already also on Trulicity which will be continued on discharge. Currently holding metformin. Sliding scale insulin.  HLD. On Crestor 20 at home.  Will increase to Crestor 40 mg.  Class III obesity. Placing the patient at high risk of poor outcome. BMI 34.5.  GERD. Continue PPI   HTN Blood pressure elevated. Currently no indication (hypertension: Continue home regimen  OSA not using CPAP. Will order CPAP.  Advance Care Planning:   Code Status: Full Code  Consults: Neurology Family Communication: Wife at bedside  Author: Berle Mull, MD 02/09/2022 7:27 PM For on call review [email or web address].

## 2022-02-09 NOTE — ED Provider Notes (Signed)
[facility] EMERGENCY DEPARTMENT Provider Note   CSN: [id number] Arrival date & time: 02/09/22  1156     History  Chief Complaint  Patient presents with   Numbness    Wayne Ho. is a 51 y.o. male.  51 year old male with prior medical history as detailed below presents for evaluation.  Patient with complaint of left-sided numbness and tingling that began on the morning of October 1.  He was previously seen here at [facility] but left without completion of the evaluation due to significant wait time.  MRI brain performed at that time showed evidence of acute infarct.  Patient returns today to complete his evaluation.  Patient complains of continued persistent tingling numbness to the left face, left arm, left thorax, and left leg.  He denies focal weakness.  He denies visual change, speech changes, other complaint.  He denies prior history of stroke.  The history is provided by the patient and medical records.       Home Medications Prior to Admission medications   Medication Sig Start Date End Date Taking? Authorizing Provider  aspirin 81 MG tablet Take 81 mg by mouth daily.   Yes [provider]  Dulaglutide (TRULICITY) 3 MG/0.5ML SOPN Inject one pen under the skin once a week as directed 12/13/21  Yes   empagliflozin (JARDIANCE) 25 MG TABS tablet Take 1 tablet by mouth once a day 02/08/21  Yes   fenofibrate (TRICOR) 145 MG tablet Take 1 tablet (145 mg total) by mouth daily. (needs appointment) 01/25/22  Yes Nahser, Deloris Ping, MD  metFORMIN (GLUCOPHAGE) 1000 MG tablet Take 1 tablet (1,000 mg total) by mouth daily with supper. 01/25/16  Yes Shelva Majestic, MD  metFORMIN (GLUCOPHAGE) 1000 MG tablet Take 1 tablet by mouth once daily with a meal 02/08/21  Yes   omeprazole (PRILOSEC) 20 MG capsule Take 20 mg by mouth as needed (for heartburn/indigestion).   Yes [provider]  rosuvastatin (CRESTOR) 20 MG tablet Take 1 tablet (20 mg total) by  mouth daily. 01/25/22  Yes Nahser, Deloris Ping, MD  sildenafil (VIAGRA) 100 MG tablet Take 0.5-1 tablets (50-100 mg total) by mouth daily as needed for erectile dysfunction. 10/17/16  Yes Shelva Majestic, MD  TRULICITY 3 MG/0.5ML SOPN Inject 3 mg into the skin once a week. 12/31/19  Yes [provider]  valsartan-hydrochlorothiazide (DIOVAN-HCT) 320-12.5 MG tablet Take 1 tablet by mouth once daily 05/14/21  Yes   glucose blood test strip 1 each by Other route as needed. Use as instructed    [provider]      Allergies    Patient has no known allergies.    Review of Systems   Review of Systems  All other systems reviewed and are negative.   Physical Exam Updated Vital Signs BP (!) 157/99   Pulse 72   Temp 98.7 F (37.1 C) (Oral)   Resp (!) 21   SpO2 98%  Physical Exam Vitals and nursing note reviewed.  Constitutional:      General: He is not in acute distress.    Appearance: Normal appearance. He is well-developed.  HENT:     Head: Normocephalic and atraumatic.  Eyes:     Conjunctiva/sclera: Conjunctivae normal.     Pupils: Pupils are equal, round, and reactive to light.  Cardiovascular:     Rate and Rhythm: Normal rate and regular rhythm.     Heart sounds: Normal heart sounds.  Pulmonary:  Effort: Pulmonary effort is normal. No respiratory distress.     Breath sounds: Normal breath sounds.  Abdominal:     General: There is no distension.     Palpations: Abdomen is soft.     Tenderness: There is no abdominal tenderness.  Musculoskeletal:        General: No deformity. Normal range of motion.     Cervical back: Normal range of motion and neck supple.  Skin:    General: Skin is warm and dry.  Neurological:     General: No focal deficit present.     Mental Status: He is alert and oriented to person, place, and time.     ED Results / Procedures / Treatments   Labs (all labs ordered are listed, but only abnormal results are displayed) Labs Reviewed   CBC WITH DIFFERENTIAL/PLATELET - Abnormal; Notable for the following components:      Result Value   Platelets 149 (*)    All other components within normal limits  BASIC METABOLIC PANEL - Abnormal; Notable for the following components:   Glucose, Bld 222 (*)    BUN 22 (*)    All other components within normal limits    EKG None  Radiology MR BRAIN WO CONTRAST  Result Date: 02/08/2022 CLINICAL DATA:  Left-sided numbness EXAM: MRI HEAD WITHOUT CONTRAST TECHNIQUE: Multiplanar, multiecho pulse sequences of the brain and surrounding structures were obtained without intravenous contrast. COMPARISON:  None Available. FINDINGS: Brain: Small acute infarct of the right thalamus. No acute or chronic hemorrhage. There is multifocal hyperintense T2-weighted signal within the white matter. Parenchymal volume and CSF spaces are normal. The midline structures are normal. Vascular: Major flow voids are preserved. Skull and upper cervical spine: Normal calvarium and skull base. Visualized upper cervical spine and soft tissues are normal. Sinuses/Orbits:No paranasal sinus fluid levels or advanced mucosal thickening. No mastoid or middle ear effusion. Normal orbits. IMPRESSION: 1. Small acute infarct of the right thalamus. No hemorrhage or mass effect. 2. Findings of chronic small vessel ischemia. Electronically Signed   By: Ulyses Jarred M.D.   On: 02/08/2022 01:44   CT HEAD WO CONTRAST  Result Date: 02/07/2022 CLINICAL DATA:  Left-sided facial and upper extremity numbness, initial encounter EXAM: CT HEAD WITHOUT CONTRAST TECHNIQUE: Contiguous axial images were obtained from the base of the skull through the vertex without intravenous contrast. RADIATION DOSE REDUCTION: This exam was performed according to the departmental dose-optimization program which includes automated exposure control, adjustment of the mA and/or kV according to patient size and/or use of iterative reconstruction technique. COMPARISON:   None Available. FINDINGS: Brain: No acute hemorrhage is identified. Small lacunar infarct is noted within the right thalamus laterally. Mild chronic white matter ischemic changes are seen. Vascular: No hyperdense vessel or unexpected calcification. Skull: Normal. Negative for fracture or focal lesion. Sinuses/Orbits: No acute finding. Other: None. IMPRESSION: Chronic ischemic changes without acute abnormality. Electronically Signed   By: Inez Catalina M.D.   On: 02/07/2022 22:46    Procedures Procedures    Medications Ordered in ED Medications - No data to display  ED Course/ Medical Decision Making/ A&P                           Medical Decision Making   Medical Screen Complete  This patient presented to the ED with complaint of paresthesia.  This complaint involves an extensive number of treatment options. The initial differential diagnosis includes, but is not  limited to, acute stroke  This presentation is: Acute, Self-Limited, Previously Undiagnosed, Uncertain Prognosis, Complicated, Systemic Symptoms, and Threat to Life/Bodily Function  Patient with complaint of tingling paresthesias to the left face, left arm, left thorax, and left leg.  Symptoms began on the morning of October 1.  Patient with MRI already performed that demonstrated acute stroke.  Patient will require admission for further work-up of his CVA.  Dr. Jerrell Belfast with neurology is aware of case and will consult.  Hospitalist service made aware of case and will evaluate for admission.  Co morbidities that complicated the patient's evaluation  Hypertension, diabetes   Additional history obtained: External records from outside sources obtained and reviewed including prior ED visits and prior Inpatient records.    Lab Tests:  I ordered and personally interpreted labs.  The pertinent results include: CBC, BMP  Problem List / ED Course:  Acute stroke   Reevaluation:  After the interventions noted above, I  reevaluated the patient and found that they have: stayed the same  Disposition:  After consideration of the diagnostic results and the patients response to treatment, I feel that the patent would benefit from admission.          Final Clinical Impression(s) / ED Diagnoses Final diagnoses:  Acute CVA (cerebrovascular accident) [facility])    Rx / DC Orders ED Discharge Orders     None         Wynetta Fines, MD 02/09/22 1704

## 2022-02-09 NOTE — Progress Notes (Deleted)
Cardiology Office Note:    Date:  02/09/2022   ID:  Wayne Ho, DOB July 17, 1970, MRN [id number]  PCP:  Wayne Maes, MD  [facility] Providers Cardiologist:  Wayne Miss, MD { Click to update primary MD,subspecialty MD or APP then REFRESH:1}  *** Referring MD: Wayne Maes, MD   Chief Complaint:  No chief complaint on file. {Click here for Visit Info    :1}    History of Present Illness:   Wayne Ho is a 51 y.o. male with  with a hx of diabetes,, hyperlipidemia, hypertension., obesity,Coronary calcium score from June 06, 2019 was 1395 which places him at the 99th percentile for age/gender matched controls, NST 03/2020 no ischemia.     Patient last saw Dr. Elease Ho 07/2020 and BP was elevated. Started on valsartan 320 mg daily. Weight loss recommended.  Patient was in ED 02/07/22 with left sided facial, upper ext and flank and lower ext numbness. BP 158/102. CT head chronic ischemic changes, no acute abnormality. MRI small acute infarct of right thalamus, no hemorrhage or mass, chronic small vessel ischemia.    Past Medical History:  Diagnosis Date   Chronic chest pain    DIABETES MELLITUS, TYPE II, CONTROLLED, MILD 07/27/2009   Dyslipidemia    ED (erectile dysfunction)    ESOPHAGITIS NEC 12/19/2006   GERD (gastroesophageal reflux disease)    Hypertension    Obesity    OBSTRUCTIVE SLEEP APNEA 11/02/2007   Current Medications: No outpatient medications have been marked as taking for the 02/09/22 encounter (Appointment) with Dyann Kief, PA-C.    Allergies:   Patient has no known allergies.   Social History   Tobacco Use   Smoking status: Never   Smokeless tobacco: Current    Types: Chew  Vaping Use   Vaping Use: Never used  Substance Use Topics   Alcohol use: No    Alcohol/week: 0.0 standard drinks of alcohol   Drug use: No    Family Hx: The patient's family history includes Coronary artery disease in an other family member; Early death in his  father; Heart disease in his father; Hypertension in his mother.  ROS   EKGs/Labs/Other Test Reviewed:    EKG:  EKG is *** ordered today.  The ekg ordered today demonstrates ***  Recent Labs: 02/07/2022: ALT 32; BUN 26; Creatinine, Ser 1.30; Hemoglobin 16.0; Platelets 153; Potassium 3.9; Sodium 137   Recent Lipid Panel No results for input(s): "CHOL", "TRIG", "HDL", "VLDL", "LDLCALC", "LDLDIRECT" in the last 8760 hours.   Prior CV Studies: {Select studies to display:26339}  ***    Risk Assessment/Calculations/Metrics:   {Does this patient have ATRIAL FIBRILLATION?:[phone]}          Physical Exam:    VS:  There were no vitals taken for this visit.    Wt Readings from Last 3 Encounters:  02/07/22 276 lb (125.2 kg)  07/17/20 276 lb 12.8 oz (125.6 kg)  02/13/20 275 lb (124.7 kg)    Physical Exam  GEN: Well nourished, well developed, in no acute distress  HEENT: normal  Neck: no JVD, carotid bruits, or masses Cardiac:RRR; no murmurs, rubs, or gallops  Respiratory:  clear to auscultation bilaterally, normal work of breathing GI: soft, nontender, nondistended, + BS Ext: without cyanosis, clubbing, or edema, Good distal pulses bilaterally MS: no deformity or atrophy  Skin: warm and dry, no rash Neuro:  Alert and Oriented x 3, Strength and sensation are intact Psych: euthymic mood, full affect  ASSESSMENT & PLAN:   No problem-specific Assessment & Plan notes found for this encounter.        {Are you ordering a CV Procedure (e.g. stress test, cath, DCCV, TEE, etc)?   Press F2        :[id number]}   Dispo:  No follow-ups on file.   Medication Adjustments/Labs and Tests Ordered: Current medicines are reviewed at length with the patient today.  Concerns regarding medicines are outlined above.  Tests Ordered: No orders of the defined types were placed in this encounter.  Medication Changes: No orders of the defined types were placed in this  encounter.  Signed, Ermalinda Barrios, PA-C  02/09/2022 8:50 AM    [facility] [address], [place], [place]  [address] Phone: [phone]; Fax: [phone]

## 2022-02-09 NOTE — ED Triage Notes (Signed)
Patient here with complaint of left sided numbness that started 10/1, presented to [facility] but left without being seen due to wait time. MRI shows small acute infarct. Patient is alert, oriented, and in no apparent distress .

## 2022-02-09 NOTE — ED Provider Triage Note (Signed)
Emergency Medicine Provider Triage Evaluation Note  Wayne Ho , Ho 51 y.o. male  was evaluated in triage.  Pt complains of numbness and weakness.  Started on Saturday PTA.  Was initially seen in the ED 2 days ago however left prior to being evaluated in the back.  He had MRI performed at that time which did show acute infarct.  He states he continues to have numbness and decree sensation to the left side of his face, left arm and left leg  Review of Systems  Positive: Numbness, weakness Negative:   Physical Exam  BP (!) 146/94 (BP Location: Right Arm)   Pulse 74   Temp 98.4 F (36.9 C) (Oral)   Resp 17   SpO2 100%  Gen:   Awake, no distress   Resp:  Normal effort  MSK:   Moves extremities without difficulty, decreased sensation to left face, left arm and left leg.  Equal handgrip bilaterally.  No drift.  Smile symmetric bilaterally, tongue midline Other:   Medical Decision Making  Medically screening exam initiated at 1:26 PM.  Appropriate orders placed.  Wayne Ho was informed that the remainder of the evaluation will be completed by another provider, this initial triage assessment does not replace that evaluation, and the importance of remaining in the ED until their evaluation is complete.  CVA on MRI 2 days ago  Will recheck basic labs, dont think need to repeat imaging at this time   Wayne Vasbinder A, PA-C 02/09/22 1327

## 2022-02-09 NOTE — Telephone Encounter (Signed)
Pt's wife is calling back to speak to someone to get clarification on urgency of appts and to get clarification as to appts that need to be scheduled. Please advise.

## 2022-02-09 NOTE — Consult Note (Signed)
Neurology Consultation  Reason for Consult: Stroke Referring Physician: Dr. Francia Greaves  CC: Left-sided numbness  History is obtained from: Patient, chart  HPI: Wayne Ho. is a 51 y.o. male past medical history of diabetes, hypertension, hyperlipidemia, obesity, sleep apnea, presenting to the emergency room after abnormal imaging of his brain was reported. He went to his doctor to be checked out because of complaints of sudden onset of left face arm and leg numbness that started upon waking up on Saturday, 02/05/2022.  He had come to the emergency room on 02/08/2022 for evaluation of the symptoms and an MRI was done that showed a small thalamic infarct but due to significant wait times, he did not wait in the hospital but returned today for completion of work-up because the symptoms are not resolving and his doctors advised that he should get the work-up completed. Reports compliance to medications Does not know his family history-he is adopted Does not smoke or drink or use illicit drugs   LKW: 0/93/2671 11:59 PM IV thrombolysis given?: no, outside the window Premorbid modified Rankin scale (mRS): 0  ROS: Full ROS was performed and is negative except as noted in the HPI.   Past Medical History:  Diagnosis Date   Chronic chest pain    DIABETES MELLITUS, TYPE II, CONTROLLED, MILD 07/27/2009   Dyslipidemia    ED (erectile dysfunction)    ESOPHAGITIS NEC 12/19/2006   GERD (gastroesophageal reflux disease)    Hypertension    Obesity    OBSTRUCTIVE SLEEP APNEA 11/02/2007     Family History  Problem Relation Age of Onset   Hypertension Mother    Heart disease Father    Early death Father    Coronary artery disease Other      Social History:   reports that he has never smoked. His smokeless tobacco use includes chew. He reports that he does not drink alcohol and does not use drugs.  Medications No current facility-administered medications for this encounter.  Current  Outpatient Medications:    aspirin 81 MG tablet, Take 81 mg by mouth daily., Disp: , Rfl:    Dulaglutide (TRULICITY) 3 IW/5.8KD SOPN, Inject one pen under the skin once a week as directed, Disp: 2 mL, Rfl: 12   empagliflozin (JARDIANCE) 25 MG TABS tablet, Take 1 tablet by mouth once a day, Disp: 90 tablet, Rfl: 4   fenofibrate (TRICOR) 145 MG tablet, Take 1 tablet (145 mg total) by mouth daily. (needs appointment), Disp: 15 tablet, Rfl: 0   metFORMIN (GLUCOPHAGE) 1000 MG tablet, Take 1 tablet (1,000 mg total) by mouth daily with supper., Disp: 90 tablet, Rfl: 3   metFORMIN (GLUCOPHAGE) 1000 MG tablet, Take 1 tablet by mouth once daily with a meal, Disp: 90 tablet, Rfl: 4   omeprazole (PRILOSEC) 20 MG capsule, Take 20 mg by mouth as needed (for heartburn/indigestion)., Disp: , Rfl:    rosuvastatin (CRESTOR) 20 MG tablet, Take 1 tablet (20 mg total) by mouth daily., Disp: 15 tablet, Rfl: 0   sildenafil (VIAGRA) 100 MG tablet, Take 0.5-1 tablets (50-100 mg total) by mouth daily as needed for erectile dysfunction., Disp: 10 tablet, Rfl: 11   TRULICITY 3 XI/3.3AS SOPN, Inject 3 mg into the skin once a week., Disp: , Rfl:    valsartan-hydrochlorothiazide (DIOVAN-HCT) 320-12.5 MG tablet, Take 1 tablet by mouth once daily, Disp: 90 tablet, Rfl: 4   glucose blood test strip, 1 each by Other route as needed. Use as instructed, Disp: , Rfl:  Exam: Current vital signs: BP (!) 157/99   Pulse 72   Temp 98.7 F (37.1 C) (Oral)   Resp (!) 21   SpO2 98%  Vital signs in last 24 hours: Temp:  [98.4 F (36.9 C)-98.7 F (37.1 C)] 98.7 F (37.1 C) (10/04 1602) Pulse Rate:  [71-78] 72 (10/04 1615) Resp:  [17-23] 21 (10/04 1615) BP: (146-162)/(93-99) 157/99 (10/04 1615) SpO2:  [97 %-100 %] 98 % (10/04 1615)  GENERAL: Awake, alert in NAD HEENT: - Normocephalic and atraumatic, dry mm, no LN++, no Thyromegally LUNGS - Clear to auscultation bilaterally with no wheezes CV - S1S2 RRR, no m/r/g, equal pulses  bilaterally. ABDOMEN - Soft, nontender, nondistended with normoactive BS Ext: warm, well perfused, intact peripheral pulses, no Nema  NEURO:  Mental Status: AA&Ox3  Language: speech is nondysarthric.  Naming, repetition, fluency, and comprehension intact. Cranial Nerves: PERRL EOMI, visual fields full, no facial asymmetry, facial sensation diminished on the left in comparison to the right, hearing intact, tongue/uvula/soft palate midline, normal sternocleidomastoid and trapezius muscle strength. No evidence of tongue atrophy or fibrillations Motor: No drift in any of the 4 extremities.  5/5 strength Tone: is normal and bulk is normal Sensation-decreased on left in comparison to the right Coordination: FTN intact bilaterally, no ataxia in BLE. Gait- deferred  NIHSS 1a Level of Conscious.: 0 1b LOC Questions: 0 1c LOC Commands: 0 2 Best Gaze: 0 3 Visual: 0 4 Facial Palsy: 0 5a Motor Arm - left: 0 5b Motor Arm - Right: 0 6a Motor Leg - Left: 0 6b Motor Leg - Right: 0 7 Limb Ataxia: 0 8 Sensory: 1 9 Best Language: 0 10 Dysarthria: 0 11 Extinct. and Inatten.: 0 TOTAL: 1   Labs I have reviewed labs in epic and the results pertinent to this consultation are:  CBC    Component Value Date/Time   WBC 6.3 02/09/2022 1336   RBC 5.43 02/09/2022 1336   HGB 16.8 02/09/2022 1336   HCT 47.3 02/09/2022 1336   PLT 149 (L) 02/09/2022 1336   MCV 87.1 02/09/2022 1336   MCH 30.9 02/09/2022 1336   MCHC 35.5 02/09/2022 1336   RDW 13.0 02/09/2022 1336   LYMPHSABS 1.8 02/09/2022 1336   MONOABS 0.5 02/09/2022 1336   EOSABS 0.1 02/09/2022 1336   BASOSABS 0.0 02/09/2022 1336    CMP     Component Value Date/Time   NA 141 02/09/2022 1336   NA 136 01/17/2020 0951   K 4.0 02/09/2022 1336   CL 106 02/09/2022 1336   CO2 24 02/09/2022 1336   GLUCOSE 222 (H) 02/09/2022 1336   BUN 22 (H) 02/09/2022 1336   BUN 13 01/17/2020 0951   CREATININE 1.09 02/09/2022 1336   CALCIUM 10.2 02/09/2022  1336   PROT 7.0 02/07/2022 2050   PROT 7.9 01/17/2020 0951   ALBUMIN 4.1 02/07/2022 2050   ALBUMIN 4.8 01/17/2020 0951   AST 25 02/07/2022 2050   ALT 32 02/07/2022 2050   ALKPHOS 71 02/07/2022 2050   BILITOT 1.3 (H) 02/07/2022 2050   BILITOT 1.0 01/17/2020 0951   GFRNONAA >60 02/09/2022 1336   GFRAA 91 01/17/2020 0951    Lipid Panel     Component Value Date/Time   CHOL 105 10/14/2019 0841   TRIG 149 10/14/2019 0841   HDL 27 (L) 10/14/2019 0841   CHOLHDL 3.9 10/14/2019 0841   CHOLHDL 4 01/18/2016 0932   VLDL 18.8 01/18/2016 0932   LDLCALC 52 10/14/2019 0841   LDLDIRECT 115.1 12/12/2013  8185     Imaging I have reviewed the images obtained:  MRI brain: Acute right thalamic infarct  Assessment: 51 year old with diabetes, hypertension, hyperlipidemia and obstructive sleep apnea along with obesity presenting for evaluation of sudden onset of left-sided numbness that started upon awakening on Saturday with last known well Friday, 02/04/2022.  Had an MRI done yesterday but did not rate for work-up completion due to significant wait times in the ER but returned back upon advice of his physicians. Likely small vessel etiology stroke involving the right thalamus due to significant risk factors of diabetes, hypertension and hyperlipidemia.   Impression: Acute ischemic stroke-small vessel etiology   Recommendations: -Admit to hospitalist -Telemetry monitoring -No need for permissive hypertension/symptoms are 56 days old. -CT Angiogram of Head and neck -Echocardiogram -HgbA1c, fasting lipid panel -Frequent neuro checks -Prophylactic therapy-Antiplatelet med: Aspirin 81+ Plavix 75 for 3 weeks followed by aspirin only unless CT angio head and neck shows severe intracranial atherosclerosis. -Atorvastatin 80 mg PO daily -Risk factor modification -PT consult, OT consult, Speech consult  Plan discussed with Dr. Rodena Medin. Plan relayed to Dr. Allena Katz, admitting hospitalist.   -- Milon Dikes, MD Neurologist [facility] Pager: [phone]

## 2022-02-09 NOTE — Telephone Encounter (Signed)
Pt's wife would like a callback regarding pt being referred to Neurologist. Wife says that she was told that pt needed to get in today but they cannot see pt until tomorrow. She would like to know the urgency of this visit due to pt having numbness in his arms. Please advise

## 2022-02-10 ENCOUNTER — Observation Stay (HOSPITAL_BASED_OUTPATIENT_CLINIC_OR_DEPARTMENT_OTHER): Payer: 59

## 2022-02-10 ENCOUNTER — Ambulatory Visit: Payer: 59 | Admitting: Physician Assistant

## 2022-02-10 ENCOUNTER — Other Ambulatory Visit (HOSPITAL_BASED_OUTPATIENT_CLINIC_OR_DEPARTMENT_OTHER): Payer: Self-pay

## 2022-02-10 ENCOUNTER — Ambulatory Visit: Payer: Self-pay | Admitting: Neurology

## 2022-02-10 DIAGNOSIS — Z6834 Body mass index (BMI) 34.0-34.9, adult: Secondary | ICD-10-CM | POA: Diagnosis not present

## 2022-02-10 DIAGNOSIS — E669 Obesity, unspecified: Secondary | ICD-10-CM | POA: Diagnosis not present

## 2022-02-10 DIAGNOSIS — Z7982 Long term (current) use of aspirin: Secondary | ICD-10-CM | POA: Diagnosis not present

## 2022-02-10 DIAGNOSIS — G4733 Obstructive sleep apnea (adult) (pediatric): Secondary | ICD-10-CM

## 2022-02-10 DIAGNOSIS — I6389 Other cerebral infarction: Secondary | ICD-10-CM

## 2022-02-10 DIAGNOSIS — E1165 Type 2 diabetes mellitus with hyperglycemia: Secondary | ICD-10-CM

## 2022-02-10 DIAGNOSIS — I10 Essential (primary) hypertension: Secondary | ICD-10-CM | POA: Diagnosis not present

## 2022-02-10 DIAGNOSIS — Z7985 Long-term (current) use of injectable non-insulin antidiabetic drugs: Secondary | ICD-10-CM | POA: Diagnosis not present

## 2022-02-10 DIAGNOSIS — Z7984 Long term (current) use of oral hypoglycemic drugs: Secondary | ICD-10-CM | POA: Diagnosis not present

## 2022-02-10 DIAGNOSIS — I6381 Other cerebral infarction due to occlusion or stenosis of small artery: Secondary | ICD-10-CM | POA: Diagnosis not present

## 2022-02-10 DIAGNOSIS — E785 Hyperlipidemia, unspecified: Secondary | ICD-10-CM

## 2022-02-10 DIAGNOSIS — E119 Type 2 diabetes mellitus without complications: Secondary | ICD-10-CM

## 2022-02-10 DIAGNOSIS — Z79899 Other long term (current) drug therapy: Secondary | ICD-10-CM | POA: Diagnosis not present

## 2022-02-10 DIAGNOSIS — I639 Cerebral infarction, unspecified: Secondary | ICD-10-CM | POA: Diagnosis not present

## 2022-02-10 LAB — RAPID URINE DRUG SCREEN, HOSP PERFORMED
Amphetamines: NOT DETECTED
Barbiturates: NOT DETECTED
Benzodiazepines: NOT DETECTED
Cocaine: NOT DETECTED
Opiates: NOT DETECTED
Tetrahydrocannabinol: NOT DETECTED

## 2022-02-10 LAB — LIPID PANEL
Cholesterol: 113 mg/dL (ref 0–200)
HDL: 31 mg/dL — ABNORMAL LOW (ref >40)
LDL Cholesterol: 56 mg/dL (ref 0–99)
Total CHOL/HDL Ratio: 3.6 RATIO
Triglycerides: 131 mg/dL (ref <150)
VLDL: 26 mg/dL (ref 0–40)

## 2022-02-10 LAB — CBG MONITORING, ED
Glucose-Capillary: 134 mg/dL — ABNORMAL HIGH (ref 70–99)
Glucose-Capillary: 145 mg/dL — ABNORMAL HIGH (ref 70–99)

## 2022-02-10 LAB — CBC
HCT: 47.4 % (ref 39.0–52.0)
Hemoglobin: 16.8 g/dL (ref 13.0–17.0)
MCH: 31.1 pg (ref 26.0–34.0)
MCHC: 35.4 g/dL (ref 30.0–36.0)
MCV: 87.6 fL (ref 80.0–100.0)
Platelets: 151 10*3/uL (ref 150–400)
RBC: 5.41 MIL/uL (ref 4.22–5.81)
RDW: 13.2 % (ref 11.5–15.5)
WBC: 6.5 10*3/uL (ref 4.0–10.5)
nRBC: 0 % (ref 0.0–0.2)

## 2022-02-10 LAB — BASIC METABOLIC PANEL
Anion gap: 11 (ref 5–15)
BUN: 22 mg/dL — ABNORMAL HIGH (ref 6–20)
CO2: 21 mmol/L — ABNORMAL LOW (ref 22–32)
Calcium: 9.6 mg/dL (ref 8.9–10.3)
Chloride: 107 mmol/L (ref 98–111)
Creatinine, Ser: 1.13 mg/dL (ref 0.61–1.24)
GFR, Estimated: >60 mL/min (ref >60)
Glucose, Bld: 120 mg/dL — ABNORMAL HIGH (ref 70–99)
Potassium: 3.7 mmol/L (ref 3.5–5.1)
Sodium: 139 mmol/L (ref 135–145)

## 2022-02-10 LAB — HIV ANTIBODY (ROUTINE TESTING W REFLEX): HIV Screen 4th Generation wRfx: NONREACTIVE

## 2022-02-10 LAB — ECHOCARDIOGRAM COMPLETE
AR max vel: 4.07 cm2
AV Area VTI: 4.26 cm2
AV Area mean vel: 3.86 cm2
AV Mean grad: 2 mmHg
AV Peak grad: 3.3 mmHg
Ao pk vel: 0.91 m/s
Area-P 1/2: 4.8 cm2
S' Lateral: 3.1 cm

## 2022-02-10 MED ORDER — PANTOPRAZOLE SODIUM 20 MG PO TBEC
20.0000 mg | DELAYED_RELEASE_TABLET | Freq: Every day | ORAL | 0 refills | Status: AC
  Filled 2022-02-10: qty 30, 30d supply, fill #0

## 2022-02-10 MED ORDER — METFORMIN HCL 1000 MG PO TABS: 1000.0000 mg | ORAL_TABLET | Freq: Every day | ORAL | 3 refills | Status: AC

## 2022-02-10 MED ORDER — ASPIRIN 81 MG PO TABS: 81.0000 mg | ORAL_TABLET | Freq: Every day | ORAL | 0 refills | Status: AC

## 2022-02-10 MED ORDER — CLOPIDOGREL BISULFATE 75 MG PO TABS
75.0000 mg | ORAL_TABLET | Freq: Every day | ORAL | 0 refills | Status: DC
  Filled 2022-02-10: qty 90, 90d supply, fill #0
  Filled 2022-04-12 – 2022-05-24 (×2): qty 30, 30d supply, fill #1

## 2022-02-10 MED ORDER — ROSUVASTATIN CALCIUM 20 MG PO TABS: 20.0000 mg | ORAL_TABLET | Freq: Every day | ORAL | Status: DC

## 2022-02-10 NOTE — Progress Notes (Signed)
SLP Cancellation Note  Patient Details Name: Wayne Ho. MRN: [id number] DOB: 11/03/1970  Cancelled treatment:       Reason Eval/Treat Not Completed: SLP screened, no needs identified, will sign off  Pt/wife were encouraged to notify PCP if difficulties arise upon return to normal routines.  Tatayana Beshears B. Quentin Ore, [facility], [place] Speech Language Pathologist Office: [phone]  Shonna Chock 02/10/2022, 12:27 PM

## 2022-02-10 NOTE — Progress Notes (Addendum)
STROKE TEAM PROGRESS NOTE   INTERVAL HISTORY His wife is at the bedside.  He states that his symptoms started on Sunday and have been persistent. He reports numbness on his left face, arm, side, and inner leg.  Vitals:   02/10/22 0755 02/10/22 0800 02/10/22 0945 02/10/22 1100  BP:  127/85 129/81 (!) 143/107  Pulse:  72 77 76  Resp:   18   Temp: 98.1 F (36.7 C)     TempSrc: Oral     SpO2:  97% 97% 96%   CBC:  Recent Labs  Lab 02/07/22 2050 02/07/22 2159 02/09/22 1336 02/10/22 0352  WBC 7.0  --  6.3 6.5  NEUTROABS 4.1  --  3.9  --   HGB 17.1*   < > 16.8 16.8  HCT 47.5   < > 47.3 47.4  MCV 84.8  --  87.1 87.6  PLT 153  --  149* 151   < > = values in this interval not displayed.   Basic Metabolic Panel:  Recent Labs  Lab 02/09/22 1336 02/10/22 0352  NA 141 139  K 4.0 3.7  CL 106 107  CO2 24 21*  GLUCOSE 222* 120*  BUN 22* 22*  CREATININE 1.09 1.13  CALCIUM 10.2 9.6   Lipid Panel:  Recent Labs  Lab 02/10/22 0352  CHOL 113  TRIG 131  HDL 31*  CHOLHDL 3.6  VLDL 26  LDLCALC 56   HgbA1c:  Recent Labs  Lab 02/09/22 2152  HGBA1C 7.6*   Urine Drug Screen:  Recent Labs  Lab 02/10/22 1020  LABOPIA NONE DETECTED  COCAINSCRNUR NONE DETECTED  LABBENZ NONE DETECTED  AMPHETMU NONE DETECTED  THCU NONE DETECTED  LABBARB NONE DETECTED    Alcohol Level No results for input(s): "ETH" in the last 168 hours.  IMAGING past 24 hours CT ANGIO HEAD NECK W WO CM  Result Date: 02/09/2022 CLINICAL DATA:  Initial evaluation for neuro deficit, stroke suspected. EXAM: CT ANGIOGRAPHY HEAD AND NECK TECHNIQUE: Multidetector CT imaging of the head and neck was performed using the standard protocol during bolus administration of intravenous contrast. Multiplanar CT image reconstructions and MIPs were obtained to evaluate the vascular anatomy. Carotid stenosis measurements (when applicable) are obtained utilizing NASCET criteria, using the distal internal carotid diameter as the  denominator. RADIATION DOSE REDUCTION: This exam was performed according to the departmental dose-optimization program which includes automated exposure control, adjustment of the mA and/or kV according to patient size and/or use of iterative reconstruction technique. CONTRAST:  40mL OMNIPAQUE IOHEXOL 350 MG/ML SOLN COMPARISON:  Prior MRI from 02/08/2022. FINDINGS: CT HEAD FINDINGS Brain: Cerebral volume within normal limits. Mild chronic microvascular ischemic disease, better characterized on recent MRI. Previously identified small right thalamic infarct is grossly stable in size from prior. No associated mass effect or hemorrhage. No other new acute large vessel territory infarct. No mass lesion or midline shift. No hydrocephalus or extra-axial fluid collection. Vascular: No abnormal hyperdense vessel. Skull: Scalp soft tissues and calvarium within normal limits. Sinuses/Orbits: Globes and orbital soft tissues within normal limits. Mastoid air cells are clear. Paranasal sinuses are largely clear as well. Other: None. Review of the MIP images confirms the above findings CTA NECK FINDINGS Aortic arch: Visualized aortic arch normal in caliber with standard 3 vessel morphology. No stenosis about the origin of the great vessels. Right carotid system: Right common and internal carotid arteries patent without dissection or occlusion. Mild atheromatous change about the right carotid bulb without hemodynamically significant stenosis. Left  carotid system: Left common and internal carotid arteries are patent without dissection or occlusion. Mild atheromatous change about the left carotid bulb without hemodynamically significant stenosis. Vertebral arteries: Both vertebral arteries arise from subclavian arteries. No proximal subclavian artery stenosis. Left vertebral artery dominant. Atheromatous change at the origin of the left vertebral artery with associated mild to moderate ostial stenosis. Vertebral arteries otherwise  patent without stenosis or dissection. Skeleton: No discrete or worrisome osseous lesions. Degenerative spondylosis with OPLL present at C3-4 and C4-5 with resultant severe spinal stenosis. Other neck: No other acute soft tissue abnormality within the neck. Upper chest: Visualized upper chest demonstrates no acute finding. Review of the MIP images confirms the above findings CTA HEAD FINDINGS Anterior circulation: Both internal carotid arteries widely patent to the termini without stenosis. A1 segments widely patent. Normal anterior communicating artery complex. Both anterior cerebral arteries widely patent to their distal aspects without stenosis. No M1 stenosis or occlusion. Normal MCA bifurcations. Distal MCA branches well perfused and symmetric. Posterior circulation: Both vertebral arteries patent to the vertebrobasilar junction without stenosis. Left PICA patent. Right PICA not well seen. Basilar patent without stenosis. Superior cerebellar arteries patent bilaterally. Left PCA supplied via the basilar. Right PCA supplied via a hypoplastic right P1 segment and robust right posterior communicating artery. Both PCAs patent to their distal aspects without stenosis. Venous sinuses: Patent allowing for timing the contrast bolus. Anatomic variants: As above.  No aneurysm. Review of the MIP images confirms the above findings IMPRESSION: CT HEAD IMPRESSION: 1. Stable size of recently identified acute right thalamic infarct. No associated hemorrhage or mass effect. 2. No other new acute intracranial abnormality. CTA HEAD AND NECK IMPRESSION: 1. Negative CTA for large vessel occlusion or other emergent finding. 2. Mild atheromatous change about the carotid bifurcations without hemodynamically significant stenosis. 3. Mild to moderate ostial stenosis at the origin of the left vertebral artery. Posterior circulation otherwise widely patent. 4. Degenerative spondylosis with OPLL at C3-4 and C4-5 with resultant severe  spinal stenosis. Electronically Signed   By: Jeannine Boga M.D.   On: 02/09/2022 23:11    PHYSICAL EXAM GENERAL: Awake, alert in NAD   NEURO:  Mental Status -  Level of arousal and orientation to time, place, and person were intact. Language including expression, naming, repetition, comprehension was assessed and found intact. Fund of Knowledge was assessed and was intact.   Cranial Nerves II - XII - II - Visual field intact OU. III, IV, VI - Extraocular movements intact. V - Facial sensation intact bilaterally. VII - Facial movement intact bilaterally. VIII - Hearing & vestibular intact bilaterally. X - Palate elevates symmetrically. XI - Chin turning & shoulder shrug intact bilaterally. XII - Tongue protrusion intact.   Motor Strength - The patient's strength was normal in RUE and RLE, LUE, and LLE. Bulk was normal and fasciculations were absent.   Motor Tone - Muscle tone was assessed at the neck and appendages and was normal.   Sensory - Light touch on the left side was less sensitive compared to the right side. This occurred on the L face, L arm, L torso, and L inner thigh and calf.  ASSESSMENT/PLAN [name]. is a 51 y.o. male with history of diabetes, hypertension, hyperlipidemia, obesity, sleep apnea, presenting to the emergency room after an abnormal MRI initially on 10/3.  Stroke:  acute right thalamic infarct, likely due to small vessel disease CT head Stable size of recently identified acute right thalamic infarct. No  associated hemorrhage or mass effect. CTA head & neck No LVO MRI  Small acute infarct of the right thalamus. No hemorrhage or mass effect. Small vessel ischemia 2D Echo pending LDL 52 HgbA1c 7.6 UDS negative VTE prophylaxis - lovenox aspirin 81 mg daily prior to admission, now on aspirin 81 mg daily and clopidogrel 75 mg daily. Will continue this combination for three weeks then only take clopidogrel afterwards. Therapy  recommendations:  none Disposition:  home  Hypertension Home meds:  valsartan-hydrochlorotiazide Stable Long-term BP goal normotensive  Hyperlipidemia Home meds:  fenofibrate, rosuvastatin 20 LDL 52, goal < 70 Continue home fenofibrate and Crestor 20 Continue statin at discharge  Diabetes type II Uncontrolled Home meds:  Jardiance, trulicity, metformin HgbA1c 7.6, goal < 7.0 CBGs SSI Close PCP follow-up for better DM control  Other Stroke Risk Factors Obesity, BMI >/= 30 associated with increased stroke risk, recommend weight loss, diet and exercise as appropriate  Obstructive sleep apnea noncompliant with CPAP  Other Active Problems AKI, creatinine 1.30-1.09-1.13  Hospital day # 0  Christia Reading, MD PGY-1 Psychiatry  ATTENDING NOTE: I reviewed above note and agree with the assessment and plan. Pt was seen and examined.   51 year old male with history of diabetes, hypertension, hyperlipidemia, obesity, OSA noncompliant with CPAP admitted for left-sided numbness.  CT no acute abnormality on 02/07/2022.  MRI small acute right thalamic infarct on 02/08/2022.  CTA head and neck no significant finding but bilateral ICA bifurcation mild atherosclerotic changes.  2D echo pending, LDL 56, A1c 7.6, UDS negative.  Creatinine 1.3-1.09-1.13.  On exam, patient neurologically intact except left forehead, left arm and left calf decreased light touch sensation.  Etiology for patient stroke likely due to small vessel disease given uncontrolled risk factors.  Recommend aspirin 81 and Plavix 75 DAPT for 3 weeks and then Plavix alone.  Continue home Crestor 20 and fenofibrate.  Stroke risk factor modification.  PT therapy no recommendation.  For detailed assessment and plan, please refer to above/below as I have made changes wherever appropriate.   Neurology will sign off. Please call with questions. Pt will follow up with [facility] soon. Thanks for the consult.   Marvel Plan, MD PhD Stroke  Neurology 02/10/2022 1:39 PM     To contact Stroke Continuity provider, please refer to [email or web address]. After hours, contact General Neurology

## 2022-02-10 NOTE — Progress Notes (Signed)
OT Cancellation Note  Patient Details Name: Wayne Ho. MRN: [id number] DOB: 12-06-1970   Cancelled Treatment:    Reason Eval/Treat Not Completed: OT screened, no needs identified, will sign off.  Advised by PT no OT needs exist.    Metta Clines 02/10/2022, 9:38 AM 02/10/2022  RP, OTR/L  Acute Rehabilitation Services  Office:  [phone]

## 2022-02-10 NOTE — Evaluation (Signed)
Physical Therapy Evaluation and Discharge Patient Details Name: Wayne Ho. MRN: [id number] DOB: 03-17-1971 Today's Date: 02/10/2022  History of Present Illness  Pt is a 51 y/o M who presented to [facility] on 10/4 with complaints of L sided numbness/tingling, acute stroke due to ischemia. Of note, pt here on 10/2 with same complaints. Significant of PMH T2DM, chronic chest pain, HTN, OSA, GERD.   Clinical Impression  Patient evaluated by Physical Therapy with no further acute PT needs identified. PTA, pt independent with mobility, self care activities, driving, and employment as Immunologist. Today pt ambulated 250+ ft supervision, progress to independent with minor verbal cues for unfamiliar hallway negotiation. Only symptoms reported are dec light touch in L UE and inconsistent regions on L LE. All education has been completed and the patient has no further questions. Pt cleared to ambulate and perform self care tasks independently with no concerns of functional ability. Acute PT is signing off. Thank you for this referral.        Recommendations for follow up therapy are one component of a multi-disciplinary discharge planning process, led by the attending physician.  Recommendations may be updated based on patient status, additional functional criteria and insurance authorization.  Follow Up Recommendations No PT follow up      Assistance Recommended at Discharge PRN  Patient can return home with the following       Equipment Recommendations None recommended by PT  Recommendations for Other Services       Functional Status Assessment Patient has not had a recent decline in their functional status     Precautions / Restrictions Restrictions Weight Bearing Restrictions: No      Mobility  Bed Mobility Overal bed mobility: Needs Assistance Bed Mobility: Supine to Sit     Supine to sit: Independent     General bed mobility comments: Independent getting out of tall bed,  no rails, no increased time necessary to complete task    Transfers Overall transfer level: Needs assistance Equipment used: None Transfers: Sit to/from Stand Sit to Stand: Independent           General transfer comment: Multiple sit to stands throughout session without use of hands    Ambulation/Gait Ambulation/Gait assistance: Supervision, Independent Gait Distance (Feet): 250 Feet Assistive device: None Gait Pattern/deviations: Narrow base of support, WFL(Within Functional Limits) Gait velocity: Unsure of baseline     General Gait Details: Pt negotiated obstacles and turns in hallway with no apparent deficits of visual neglect. Sustained steady gait speed. Began ambualtion with supervision, progresses to independence in room once better idea of patients functional mobility abilities.  Stairs            Wheelchair Mobility    Modified Rankin (Stroke Patients Only) Modified Rankin (Stroke Patients Only) Pre-Morbid Rankin Score: No symptoms Modified Rankin: No significant disability     Balance Overall balance assessment: Needs assistance Sitting-balance support: No upper extremity supported Sitting balance-Leahy Scale: Normal Sitting balance - Comments: Various weight shifting without difficulty; good dynamic sitting balance reaching across body, outside BOS, lateral leaning without any uncertainty of maintaining balance   Standing balance support: No upper extremity supported Standing balance-Leahy Scale: Good Standing balance comment: Pt able to reach down to ground to pick up items off of floor, no LOB. Use of UEs during dynamic standing with no deficits observed  Pertinent Vitals/Pain Pain Assessment Pain Assessment: No/denies pain    Home Living Family/patient expects to be discharged to:: Private residence Living Arrangements: Spouse/significant other Available Help at Discharge: Family;Available  PRN/intermittently Type of Home: House Home Access: Stairs to enter Entrance Stairs-Rails: None Entrance Stairs-Number of Steps: 3   Home Layout: One level        Prior Function Prior Level of Function : Independent/Modified Independent             Mobility Comments: Pt ambulated without assistive device ADLs Comments: Independent in self care, splits IADLs with spouse     Hand Dominance        Extremity/Trunk Assessment   Upper Extremity Assessment Upper Extremity Assessment: Overall WFL for tasks assessed;LUE deficits/detail (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 No sensory changes in posterior trunk. LUE Sensation: decreased light touch LUE Coordination: WNL    Lower Extremity Assessment Lower Extremity Assessment: Overall WFL for tasks assessed;LLE deficits/detail (Functional hip, ankle, knee strength assessed sitting EOB) LLE Deficits / Details: Dull touch compared to contralateral side, deficits observed greater in medial lower leg and lateral thigh LLE Sensation: decreased light touch LLE Coordination: WNL    Cervical / Trunk Assessment Cervical / Trunk Assessment: Normal  Communication   Communication: No difficulties  Cognition Arousal/Alertness: Awake/alert Behavior During Therapy: WFL for tasks assessed/performed Overall Cognitive Status: Within Functional Limits for tasks assessed                                 General Comments: Pt AOx4, able to sustain split attention and re-direct to previous conversation with no deficits of carryover. Pt able to complete multistep commands with no verbal cues. Aware of items out of place and able to reposition them appropriately.        General Comments General comments (skin integrity, edema, etc.): Pt still drives and works as a Immunologist. BP before session  127/85    Exercises     Assessment/Plan    PT Assessment Patient does not need any further PT services  PT Problem List         PT Treatment Interventions      PT Goals (Current goals can be found in the Care Plan section)  Acute Rehab PT Goals Patient Stated Goal: to get back to home and work PT Goal Formulation: All assessment and education complete, DC therapy Time For Goal Achievement: 02/24/22 Potential to Achieve Goals: Good    Frequency       Co-evaluation               AM-PAC PT "6 Clicks" Mobility  Outcome Measure Help needed turning from your back to your side while in a flat bed without using bedrails?: None Help needed moving from lying on your back to sitting on the side of a flat bed without using bedrails?: None Help needed moving to and from a bed to a chair (including a wheelchair)?: None Help needed standing up from a chair using your arms (e.g., wheelchair or bedside chair)?: None Help needed to walk in hospital room?: None Help needed climbing 3-5 steps with a railing? : None 6 Click Score: 24    End of Session Equipment Utilized During Treatment: Gait belt Activity Tolerance: Patient tolerated treatment well Patient left: in bed;with call  bell/phone within reach Nurse Communication: Mobility status PT Visit Diagnosis: Other symptoms and signs involving the nervous system [id number])    Time: [id number] PT Time Calculation (min) (ACUTE ONLY): 14 min   Charges:   PT Evaluation $PT Eval Low Complexity: 1 Low         Chipper Oman, SPT   [place] Trudy Kory 02/10/2022, 10:53 AM

## 2022-02-10 NOTE — ED Notes (Signed)
PATIENT WALKS TO BATHROOM AND MONITOR

## 2022-02-21 ENCOUNTER — Encounter: Payer: Self-pay | Admitting: Neurology

## 2022-02-21 ENCOUNTER — Ambulatory Visit (INDEPENDENT_AMBULATORY_CARE_PROVIDER_SITE_OTHER): Payer: 59 | Admitting: Neurology

## 2022-02-21 VITALS — BP 160/105 | HR 77 | Ht 75.0 in | Wt 266.0 lb

## 2022-02-21 DIAGNOSIS — G4733 Obstructive sleep apnea (adult) (pediatric): Secondary | ICD-10-CM

## 2022-02-21 DIAGNOSIS — E1165 Type 2 diabetes mellitus with hyperglycemia: Secondary | ICD-10-CM

## 2022-02-21 DIAGNOSIS — R202 Paresthesia of skin: Secondary | ICD-10-CM

## 2022-02-21 DIAGNOSIS — I6381 Other cerebral infarction due to occlusion or stenosis of small artery: Secondary | ICD-10-CM

## 2022-02-21 NOTE — Progress Notes (Signed)
[facility] [address] [place]. [place] [address] [phone]       OFFICE CONSULT NOTE  [name]. Date of Birth:  1971-05-02 Medical Record Number:  [id number]   Referring MD: Wayne Ho  Reason for Referral: Stroke  HPI: [name] is a 51 year old pleasant African-American male seen today for initial office consultation visit f for stroke.  Patient is accompanied by his wife today.  History is obtained from them and review of electronic medical records and I personally reviewed pertinent available imaging films in PACS.  Patient developed sudden onset of numbness and tingling involving the left face, neck, fingertips on 02/07/2022.  The symptoms have persisted since then likely improved but they have never gone away.,  He denies any trouble walking, gait or balance problems.  There was no accompanying headache.  MRI scan of the brain confirmed right thalamic lacunar infarct.  CT angiogram of the brain and neck showed only minor arteriosclerotic changes.  LDL cholesterol was 52 mg%.t.  Hemoglobin A1c was 7.6.  Urine drug screen was negative.  Patient was discharged home on aspirin and Plavix.?  Tolerating well with minor bruising and no bleeding.  He states his blood pressure is better controlled at home though he admits he does not check it regularly.  Today it is 160/105 in office.  He is tolerating Crestor and Tricor well without side effects.  States his sugars are under good control down further.  He does snore and has known history of sleep apnea was prescribed CPAP mask he was unable to tolerate it in the past.  He denies any other prior history of strokes, TIAs, migraines or any other neurological problem.  Does not smoke and drinks alcohol socially.  He is working full-time as a Programmer, systems  ROS:   14 system review of systems is positive for numbness, tingling all other systems negative  PMH:  Past Medical History:  Diagnosis Date   Chronic  chest pain    DIABETES MELLITUS, TYPE II, CONTROLLED, MILD 07/27/2009   Dyslipidemia    ED (erectile dysfunction)    ESOPHAGITIS NEC 12/19/2006   GERD (gastroesophageal reflux disease)    Hypertension    Obesity    OBSTRUCTIVE SLEEP APNEA 11/02/2007    Social History:  Social History   Socioeconomic History   Marital status: Married    Spouse name: Not on file   Number of children: Not on file   Years of education: Not on file   Highest education level: Not on file  Occupational History   Not on file  Tobacco Use   Smoking status: Never   Smokeless tobacco: Current    Types: Chew  Vaping Use   Vaping Use: Never used  Substance and Sexual Activity   Alcohol use: No    Alcohol/week: 0.0 standard drinks of alcohol   Drug use: No   Sexual activity: Yes  Other Topics Concern   Not on file  Social History Narrative      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 21 tablet 0   clopidogrel (PLAVIX) 75 MG tablet Take 1 tablet (75 mg total) by mouth daily. 120 tablet 0   Dulaglutide (TRULICITY) 3 MG/0.5ML SOPN Inject one pen under the skin once a week as directed 2 mL 12   empagliflozin (JARDIANCE) 25 MG TABS tablet Take 1 tablet by mouth once a day 90 tablet 4   fenofibrate (TRICOR) 145 MG tablet Take 1 tablet (145 mg total) by mouth daily. (needs appointment) 15 tablet 0   glucose blood test strip 1 each by Other route as needed. Use as instructed      metFORMIN (GLUCOPHAGE) 1000 MG tablet Take 1 tablet (1,000 mg total) by mouth daily with supper. 90 tablet 3   pantoprazole (PROTONIX) 20 MG tablet Take 1 tablet (20 mg total) by mouth daily. 30 tablet 0   rosuvastatin (CRESTOR) 20 MG tablet Take 1 tablet (20 mg total) by mouth daily. 15 tablet 0   sildenafil (VIAGRA) 100 MG tablet Take 0.5-1 tablets (50-100 mg total) by mouth daily as needed for erectile dysfunction. 10 tablet 11   valsartan-hydrochlorothiazide (DIOVAN-HCT) 320-12.5 MG tablet Take 1 tablet by mouth once daily 90 tablet 4   No current facility-administered medications on file prior to visit.    Allergies:  No Known Allergies  Physical Exam General: Mildly obese pleasant middle-age African-American male, seated, in no evident distress Head: head normocephalic and atraumatic.   Neck: supple with no carotid or supraclavicular bruits Cardiovascular: regular rate and rhythm, no murmurs Musculoskeletal: no deformity Skin:  no rash/petichiae Vascular:  Normal pulses all extremities  Neurologic Exam Mental Status: Awake and fully alert. Oriented to place and time. Recent and remote memory intact. Attention span, concentration and fund of knowledge appropriate. Mood and affect appropriate.  Cranial Nerves: Fundoscopic exam reveals sharp disc margins. Pupils equal, briskly reactive to light. Extraocular movements full without nystagmus. Visual fields full to confrontation. Hearing intact. Facial sensation intact. Face, tongue, palate moves normally and symmetrically.  Motor: Normal bulk and tone. Normal strength in all tested extremity muscles. Sensory.: intact to touch , pinprick , position and vibratory sensation.  Mild subjective paresthesias in the left face and but no objective sensory loss. Coordination: Rapid alternating movements normal in all extremities. Finger-to-nose and heel-to-shin performed accurately bilaterally. Gait and Station: Arises from chair without difficulty.  Stance is normal. Gait demonstrates normal stride length and balance . Able to heel, toe and tandem walk without difficulty.  Reflexes: 1+ and symmetric. Toes downgoing.   NIHSS  1 Modified Rankin  1   ASSESSMENT: 51 year old African-American male with right thalamic lacunar infarct in October 2023 from small vessel disease.  Vascular risk factors diabetes, hypertension and hyperlipidemia and mild obesity at risk for sleep apnea     PLAN: I had a long d/w patient and his wife about his recent thalamic lacunar stroke, post stroke paresthesias,risk for recurrent stroke/TIAs, personally independently reviewed imaging studies and stroke evaluation results and answered questions.Continue aspirin 81 mg daily and clopidogrel 75 mg daily  x 3 weeks and then Plavix ( clopidogrel ) alone for secondary stroke prevention and maintain strict control of hypertension with blood pressure goal below 130/90, diabetes with hemoglobin A1c goal below 6.5% and lipids with LDL cholesterol goal below 70 mg/dL. I also advised the patient to eat a healthy diet with plenty of whole  grains, cereals, fruits and vegetables, exercise regularly and maintain ideal body weight .Rfere to sleep medicine MD for discussion about sleep apnoea treatmernt options.Followup in the future with my nurse practitioner in 6 months or call earlier if needed.  Greater than 50% time during this 45-minute visit was spent on counseling and coordination of care about his thalamic stroke.and   post stroke paresthesias and answering questions  Antony Contras, MD Note: This document was prepared with digital dictation and possible smart phrase technology. Any transcriptional errors that result from this process are unintentional.

## 2022-02-21 NOTE — Patient Instructions (Signed)
I had a long d/w patient and his wife about his recent thalamic lacunar stroke, post stroke paresthesias,risk for recurrent stroke/TIAs, personally independently reviewed imaging studies and stroke evaluation results and answered questions.Continue aspirin 81 mg daily and clopidogrel 75 mg daily  x 3 weeks and then Plavix ( clopidogrel ) alone for secondary stroke prevention and maintain strict control of hypertension with blood pressure goal below 130/90, diabetes with hemoglobin A1c goal below 6.5% and lipids with LDL cholesterol goal below 70 mg/dL. I also advised the patient to eat a healthy diet with plenty of whole grains, cereals, fruits and vegetables, exercise regularly and maintain ideal body weight .Rfere to sleep medicine MD for discussion about sleep apnoea treatmernt options.Followup in the future with my nurse practitioner in 6 months or call earlier if needed.  Stroke Prevention Some medical conditions and behaviors can lead to a higher chance of having a stroke. You can help prevent a stroke by eating healthy, exercising, not smoking, and managing any medical conditions you have. Stroke is a leading cause of functional impairment. Primary prevention is particularly important because a majority of strokes are first-time events. Stroke changes the lives of not only those who experience a stroke but also their family and other caregivers. How can this condition affect me? A stroke is a medical emergency and should be treated right away. A stroke can lead to brain damage and can sometimes be life-threatening. If a person gets medical treatment right away, there is a better chance of surviving and recovering from a stroke. What can increase my risk? The following medical conditions may increase your risk of a stroke: Cardiovascular disease. High blood pressure (hypertension). Diabetes. High cholesterol. Sickle cell disease. Blood clotting disorders (hypercoagulable state). Obesity. Sleep  disorders (obstructive sleep apnea). Other risk factors include: Being older than age 6. Having a history of blood clots, stroke, or mini-stroke (transient ischemic attack, TIA). Genetic factors, such as race, ethnicity, or a family history of stroke. Smoking cigarettes or using other tobacco products. Taking birth control pills, especially if you also use tobacco. Heavy use of alcohol or drugs, especially cocaine and methamphetamine. Physical inactivity. What actions can I take to prevent this? Manage your health conditions High cholesterol levels. Eating a healthy diet is important for preventing high cholesterol. If cholesterol cannot be managed through diet alone, you may need to take medicines. Take any prescribed medicines to control your cholesterol as told by your health care provider. Hypertension. To reduce your risk of stroke, try to keep your blood pressure below 130/80. Eating a healthy diet and exercising regularly are important for controlling blood pressure. If these steps are not enough to manage your blood pressure, you may need to take medicines. Take any prescribed medicines to control hypertension as told by your health care provider. Ask your health care provider if you should monitor your blood pressure at home. Have your blood pressure checked every year, even if your blood pressure is normal. Blood pressure increases with age and some medical conditions. Diabetes. Eating a healthy diet and exercising regularly are important parts of managing your blood sugar (glucose). If your blood sugar cannot be managed through diet and exercise, you may need to take medicines. Take any prescribed medicines to control your diabetes as told by your health care provider. Get evaluated for obstructive sleep apnea. Talk to your health care provider about getting a sleep evaluation if you snore a lot or have excessive sleepiness. Make sure that any other medical conditions  you have,  such as atrial fibrillation or atherosclerosis, are managed. Nutrition Follow instructions from your health care provider about what to eat or drink to help manage your health condition. These instructions may include: Reducing your daily calorie intake. Limiting how much salt (sodium) you use to 1,500 milligrams (mg) each day. Using only healthy fats for cooking, such as olive oil, canola oil, or sunflower oil. Eating healthy foods. You can do this by: Choosing foods that are high in fiber, such as whole grains, and fresh fruits and vegetables. Eating at least 5 servings of fruits and vegetables a day. Try to fill one-half of your plate with fruits and vegetables at each meal. Choosing lean protein foods, such as lean cuts of meat, poultry without skin, fish, tofu, beans, and nuts. Eating low-fat dairy products. Avoiding foods that are high in sodium. This can help lower blood pressure. Avoiding foods that have saturated fat, trans fat, and cholesterol. This can help prevent high cholesterol. Avoiding processed and prepared foods. Counting your daily carbohydrate intake.  Lifestyle If you drink alcohol: Limit how much you have to: 0-1 drink a day for women who are not pregnant. 0-2 drinks a day for men. Know how much alcohol is in your drink. In the [place] one drink equals one 12 oz bottle of beer (324mL), one 5 oz glass of wine (169mL), or one 1 oz glass of hard liquor (28mL). Do not use any products that contain nicotine or tobacco. These products include cigarettes, chewing tobacco, and vaping devices, such as e-cigarettes. If you need help quitting, ask your health care provider. Avoid secondhand smoke. Do not use drugs. Activity  Try to stay at a healthy weight. Get at least 30 minutes of exercise on most days, such as: Fast walking. Biking. Swimming. Medicines Take over-the-counter and prescription medicines only as told by your health care provider. Aspirin or blood thinners  (antiplatelets or anticoagulants) may be recommended to reduce your risk of forming blood clots that can lead to stroke. Avoid taking birth control pills. Talk to your health care provider about the risks of taking birth control pills if: You are over 15 years old. You smoke. You get very bad headaches. You have had a blood clot. Where to find more information American Stroke Association: www.strokeassociation.org Get help right away if: You or a loved one has any symptoms of a stroke. "BE FAST" is an easy way to remember the main warning signs of a stroke: B - Balance. Signs are dizziness, sudden trouble walking, or loss of balance. E - Eyes. Signs are trouble seeing or a sudden change in vision. F - Face. Signs are sudden weakness or numbness of the face, or the face or eyelid drooping on one side. A - Arms. Signs are weakness or numbness in an arm. This happens suddenly and usually on one side of the body. S - Speech. Signs are sudden trouble speaking, slurred speech, or trouble understanding what people say. T - Time. Time to call emergency services. Write down what time symptoms started. You or a loved one has other signs of a stroke, such as: A sudden, severe headache with no known cause. Nausea or vomiting. Seizure. These symptoms may represent a serious problem that is an emergency. Do not wait to see if the symptoms will go away. Get medical help right away. Call your local emergency services (911 in the [place] Do not drive yourself to the hospital. Summary You can help to prevent a stroke by  eating healthy, exercising, not smoking, limiting alcohol intake, and managing any medical conditions you may have. Do not use any products that contain nicotine or tobacco. These include cigarettes, chewing tobacco, and vaping devices, such as e-cigarettes. If you need help quitting, ask your health care provider. Remember "BE FAST" for warning signs of a stroke. Get help right away if you or a  loved one has any of these signs. This information is not intended to replace advice given to you by your health care provider. Make sure you discuss any questions you have with your health care provider. Document Revised: 11/25/2019 Document Reviewed: 11/25/2019 Elsevier Patient Education  2023 [employer].

## 2022-02-22 ENCOUNTER — Other Ambulatory Visit: Payer: Self-pay | Admitting: Cardiovascular Disease

## 2022-02-22 ENCOUNTER — Other Ambulatory Visit (HOSPITAL_BASED_OUTPATIENT_CLINIC_OR_DEPARTMENT_OTHER): Payer: Self-pay

## 2022-02-22 ENCOUNTER — Other Ambulatory Visit (HOSPITAL_COMMUNITY): Payer: Self-pay

## 2022-02-22 DIAGNOSIS — E785 Hyperlipidemia, unspecified: Secondary | ICD-10-CM

## 2022-02-22 DIAGNOSIS — E119 Type 2 diabetes mellitus without complications: Secondary | ICD-10-CM

## 2022-02-22 DIAGNOSIS — I10 Essential (primary) hypertension: Secondary | ICD-10-CM

## 2022-02-22 MED ORDER — ROSUVASTATIN CALCIUM 20 MG PO TABS
20.0000 mg | ORAL_TABLET | Freq: Every day | ORAL | 0 refills | Status: DC
  Filled 2022-02-22: qty 15, 15d supply, fill #0

## 2022-02-22 MED ORDER — FENOFIBRATE 145 MG PO TABS
145.0000 mg | ORAL_TABLET | Freq: Every day | ORAL | 0 refills | Status: DC
  Filled 2022-02-22: qty 15, 15d supply, fill #0

## 2022-02-24 ENCOUNTER — Other Ambulatory Visit (HOSPITAL_BASED_OUTPATIENT_CLINIC_OR_DEPARTMENT_OTHER): Payer: Self-pay

## 2022-02-24 ENCOUNTER — Other Ambulatory Visit: Payer: Self-pay

## 2022-02-28 ENCOUNTER — Other Ambulatory Visit (HOSPITAL_BASED_OUTPATIENT_CLINIC_OR_DEPARTMENT_OTHER): Payer: Self-pay

## 2022-02-28 MED ORDER — JARDIANCE 25 MG PO TABS
25.0000 mg | ORAL_TABLET | Freq: Every day | ORAL | 4 refills | Status: DC
  Filled 2022-02-28: qty 90, 90d supply, fill #0
  Filled 2022-04-12 – 2022-06-21 (×2): qty 90, 90d supply, fill #1
  Filled 2022-09-15: qty 90, 90d supply, fill #2
  Filled 2022-12-14 – 2022-12-30 (×2): qty 90, 90d supply, fill #3

## 2022-03-11 ENCOUNTER — Other Ambulatory Visit (HOSPITAL_BASED_OUTPATIENT_CLINIC_OR_DEPARTMENT_OTHER): Payer: Self-pay

## 2022-03-15 ENCOUNTER — Other Ambulatory Visit: Payer: Self-pay | Admitting: Cardiovascular Disease

## 2022-03-15 DIAGNOSIS — E119 Type 2 diabetes mellitus without complications: Secondary | ICD-10-CM

## 2022-03-15 DIAGNOSIS — I10 Essential (primary) hypertension: Secondary | ICD-10-CM

## 2022-03-15 DIAGNOSIS — E785 Hyperlipidemia, unspecified: Secondary | ICD-10-CM

## 2022-03-16 ENCOUNTER — Telehealth: Payer: Self-pay | Admitting: Cardiovascular Disease

## 2022-03-16 ENCOUNTER — Other Ambulatory Visit (HOSPITAL_BASED_OUTPATIENT_CLINIC_OR_DEPARTMENT_OTHER): Payer: Self-pay

## 2022-03-16 DIAGNOSIS — E119 Type 2 diabetes mellitus without complications: Secondary | ICD-10-CM

## 2022-03-16 DIAGNOSIS — I10 Essential (primary) hypertension: Secondary | ICD-10-CM

## 2022-03-16 DIAGNOSIS — E785 Hyperlipidemia, unspecified: Secondary | ICD-10-CM

## 2022-03-16 MED ORDER — ROSUVASTATIN CALCIUM 20 MG PO TABS
20.0000 mg | ORAL_TABLET | Freq: Every day | ORAL | 0 refills | Status: DC
  Filled 2022-03-16: qty 30, 30d supply, fill #0

## 2022-03-16 MED ORDER — FENOFIBRATE 145 MG PO TABS
145.0000 mg | ORAL_TABLET | Freq: Every day | ORAL | 0 refills | Status: DC
  Filled 2022-03-16: qty 30, 30d supply, fill #0

## 2022-03-16 NOTE — Telephone Encounter (Signed)
*  STAT* If patient is at the pharmacy, call can be transferred to refill team.   1. Which medications need to be refilled? (please list name of each medication and dose if known) fenofibrate (TRICOR) 145 MG tablet rosuvastatin (CRESTOR) 20 MG tablet  2. Which pharmacy/location (including street and city if local pharmacy) is medication to be sent to? [facility]   3. Do they need a 30 day or 90 day supply?  90 day supply

## 2022-03-16 NOTE — Telephone Encounter (Signed)
Pt's medication was sent to pt's pharmacy as requested. Confirmation received.  °

## 2022-03-23 ENCOUNTER — Encounter: Payer: 59 | Admitting: Cardiovascular Disease

## 2022-03-23 NOTE — Progress Notes (Signed)
This encounter was created in error - please disregard.

## 2022-04-11 DIAGNOSIS — H524 Presbyopia: Secondary | ICD-10-CM | POA: Diagnosis not present

## 2022-04-12 ENCOUNTER — Other Ambulatory Visit (HOSPITAL_BASED_OUTPATIENT_CLINIC_OR_DEPARTMENT_OTHER): Payer: Self-pay

## 2022-04-12 ENCOUNTER — Other Ambulatory Visit: Payer: Self-pay | Admitting: Cardiovascular Disease

## 2022-04-12 DIAGNOSIS — E785 Hyperlipidemia, unspecified: Secondary | ICD-10-CM

## 2022-04-12 DIAGNOSIS — I10 Essential (primary) hypertension: Secondary | ICD-10-CM

## 2022-04-12 DIAGNOSIS — E119 Type 2 diabetes mellitus without complications: Secondary | ICD-10-CM

## 2022-04-14 ENCOUNTER — Other Ambulatory Visit (HOSPITAL_BASED_OUTPATIENT_CLINIC_OR_DEPARTMENT_OTHER): Payer: Self-pay

## 2022-04-14 MED ORDER — ROSUVASTATIN CALCIUM 20 MG PO TABS
20.0000 mg | ORAL_TABLET | Freq: Every day | ORAL | 0 refills | Status: DC
  Filled 2022-04-14: qty 15, 15d supply, fill #0

## 2022-04-14 MED ORDER — FENOFIBRATE 145 MG PO TABS
145.0000 mg | ORAL_TABLET | Freq: Every day | ORAL | 0 refills | Status: DC
  Filled 2022-04-14: qty 15, 15d supply, fill #0

## 2022-04-20 ENCOUNTER — Other Ambulatory Visit: Payer: Self-pay

## 2022-04-20 DIAGNOSIS — G8929 Other chronic pain: Secondary | ICD-10-CM | POA: Insufficient documentation

## 2022-04-22 ENCOUNTER — Encounter: Payer: Self-pay | Admitting: Cardiology

## 2022-04-22 ENCOUNTER — Ambulatory Visit: Payer: 59 | Attending: Cardiology | Admitting: Cardiology

## 2022-04-22 VITALS — BP 130/76 | HR 70 | Ht 75.0 in | Wt 253.1 lb

## 2022-04-22 DIAGNOSIS — I10 Essential (primary) hypertension: Secondary | ICD-10-CM

## 2022-04-22 DIAGNOSIS — I639 Cerebral infarction, unspecified: Secondary | ICD-10-CM

## 2022-04-22 DIAGNOSIS — I2510 Atherosclerotic heart disease of native coronary artery without angina pectoris: Secondary | ICD-10-CM | POA: Diagnosis not present

## 2022-04-22 DIAGNOSIS — I2584 Coronary atherosclerosis due to calcified coronary lesion: Secondary | ICD-10-CM | POA: Diagnosis not present

## 2022-04-22 DIAGNOSIS — E785 Hyperlipidemia, unspecified: Secondary | ICD-10-CM | POA: Diagnosis not present

## 2022-04-22 DIAGNOSIS — G4733 Obstructive sleep apnea (adult) (pediatric): Secondary | ICD-10-CM | POA: Diagnosis not present

## 2022-04-22 NOTE — Patient Instructions (Signed)
Medication Instructions:  Your physician recommends that you continue on your current medications as directed. Please refer to the Current Medication list given to you today.  *If you need a refill on your cardiac medications before your next appointment, please call your pharmacy*   Lab Work: None If you have labs (blood work) drawn today and your tests are completely normal, you will receive your results only by: MyChart Message (if you have MyChart) OR A paper copy in the mail If you have any lab test that is abnormal or we need to change your treatment, we will call you to review the results.   Testing/Procedures: Cardiac Event monitor for 1 month   Follow-Up: At [facility], you and your health needs are our priority.  As part of our continuing mission to provide you with exceptional heart care, we have created designated Provider Care Teams.  These Care Teams include your primary Cardiologist (physician) and Advanced Practice Providers (APPs -  Physician Assistants and Nurse Practitioners) who all work together to provide you with the care you need, when you need it.  We recommend signing up for the patient portal called "MyChart".  Sign up information is provided on this After Visit Summary.  MyChart is used to connect with patients for Virtual Visits (Telemedicine).  Patients are able to view lab/test results, encounter notes, upcoming appointments, etc.  Non-urgent messages can be sent to your provider as well.   To learn more about what you can do with MyChart, go to [email or web address].    Your next appointment:   9 month(s)  The format for your next appointment:   In Person  Provider:   Belva Crome, MD    Other Instructions None  Important Information About Sugar

## 2022-04-22 NOTE — Progress Notes (Signed)
Cardiology Office Note:    Date:  04/22/2022   ID:  Wayne Anna., DOB [birth year], MRN [id number]  PCP:  Wayne Housekeeper, MD  Cardiologist:  Garwin Brothers, MD   Referring MD: Wayne Housekeeper, MD    ASSESSMENT:    1. Acute stroke due to ischemia (HCC)   2. Coronary artery calcification   3. Essential hypertension, benign   4. Obstructive sleep apnea   5. DIABETES MELLITUS, TYPE II, CONTROLLED, MILD   6. Class 2 severe obesity due to excess calories with serious comorbidity in adult, unspecified BMI (HCC)    PLAN:    In order of problems listed above:  Primary prevention stressed with the patient.  Importance of compliance with diet medication stressed and he vocalized understanding.  He was advised to exercise on a regular basis and continue his excellent protocol. Essential hypertension: Blood pressure stable and diet was emphasized.  Lifestyle modification urged. Obesity: Weight reduction stressed and he is doing an awesome job with this. Mixed dyslipidemia: On lipid-lowering medications.  KPN sheet was reviewed and lipids found to be fine. History of stroke and palpitations: We will do a 1 month monitor to assess this.  At this will help [place] rule out any arrhythmias or any such issues. Patient will be seen in follow-up appointment in 6 months or earlier if the patient has any concerns.  Patient had multiple questions which were answered to satisfaction.   Medication Adjustments/Labs and Tests Ordered: Current medicines are reviewed at length with the patient today.  Concerns regarding medicines are outlined above.  No orders of the defined types were placed in this encounter.  No orders of the defined types were placed in this encounter.    No chief complaint on file.    History of Present Illness:    Wayne Smead. is a 51 y.o. male.  Patient has past medical history of elevated calcium score and the number was markedly elevated, essential hypertension,  dyslipidemia, diabetes mellitus and obesity.  He has lost significant amount of weight.  He denies any chest pain orthopnea or PND.  He uses the elliptical trainer for more than half an hour on a daily basis at least 5 days a week.  He is exercise protocol is impressive.  He occasionally has palpitations.  At the time of my evaluation, the patient is alert awake oriented and in no distress.  Past Medical History:  Diagnosis Date   Acute stroke due to ischemia (HCC) 02/09/2022   Chronic chest pain    Coronary artery calcification 10/14/2019   Dyslipidemia    Erectile dysfunction 10/17/2014   Essential hypertension, benign 12/27/2013   GERD (gastroesophageal reflux disease)    Hyperlipidemia 12/27/2013   ASA      Lab Results  Component  Value  Date     CHOL  169  12/12/2013     HDL  31.90*  12/12/2013     LDLCALC  89  11/22/2010     LDLDIRECT  115.1  12/12/2013     TRIG  249.0*  12/12/2013     CHOLHDL  5  12/12/2013         Obesity    Obstructive sleep apnea 11/02/2007   Never followed through with testing   Rash and nonspecific skin eruption 10/17/2014   Uncontrolled type 2 diabetes mellitus with hyperglycemia, without long-term current use of insulin (HCC) 07/27/2009   Lantus 35 units. saxliptin-metformin 5-1000mg  daily     Past Surgical  History:  Procedure Laterality Date   ANTERIOR CRUCIATE LIGAMENT REPAIR     both knees   CHOLECYSTECTOMY  11/07    Current Medications: Current Meds  Medication Sig   clopidogrel (PLAVIX) 75 MG tablet Take 1 tablet (75 mg total) by mouth daily.   Dulaglutide (TRULICITY) 3 MG/0.5ML SOPN Inject one pen under the skin once a week as directed   empagliflozin (JARDIANCE) 25 MG TABS tablet Take 1 tablet by mouth once a day   fenofibrate (TRICOR) 145 MG tablet Take 1 tablet (145 mg total) by mouth daily. Please call to schedule an overdue appointment with Dr. Elease Hashimoto for refills, [phone], thank you. FINAL ATTEMPT   metFORMIN (GLUCOPHAGE) 1000 MG  tablet Take 1 tablet (1,000 mg total) by mouth daily with supper.   rosuvastatin (CRESTOR) 20 MG tablet Take 1 tablet (20 mg total) by mouth daily. Please call to schedule an overdue appointment with Dr. Elease Hashimoto for refills, [phone], thank you. FINAL ATTEMPT   sildenafil (VIAGRA) 100 MG tablet Take 0.5-1 tablets (50-100 mg total) by mouth daily as needed for erectile dysfunction.   tadalafil (CIALIS) 20 MG tablet Take 20 mg by mouth every 36 (thirty-six) hours. As needed for ED   valsartan-hydrochlorothiazide (DIOVAN-HCT) 320-12.5 MG tablet Take 1 tablet by mouth once daily     Allergies:   Patient has no known allergies.   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  ROS:   Please see the history of present illness.    All other systems reviewed and are negative.  EKGs/Labs/Other Studies Reviewed:    The following studies were  reviewed today: EKG reveals sinus rhythm and nonspecific ST-T changes   Recent Labs: 02/07/2022: ALT 32 02/10/2022: BUN 22; Creatinine, Ser 1.13; Hemoglobin 16.8; Platelets 151; Potassium 3.7; Sodium 139  Recent Lipid Panel    Component Value Date/Time   CHOL 113 02/10/2022 0352   CHOL 105 10/14/2019 0841   TRIG 131 02/10/2022 0352   HDL 31 (L) 02/10/2022 0352   HDL 27 (L) 10/14/2019 0841   CHOLHDL 3.6 02/10/2022 0352   VLDL 26 02/10/2022 0352   LDLCALC 56 02/10/2022 0352   LDLCALC 52 10/14/2019 0841   LDLDIRECT 115.1 12/12/2013 0816    Physical Exam:  VS:  BP 130/76   Pulse 70   Ht 6\' 3"  (1.905 m)   Wt 253 lb 1.3 oz (114.8 kg)   SpO2 96%   BMI 31.63 kg/m     Wt Readings from Last 3 Encounters:  04/22/22 253 lb 1.3 oz (114.8 kg)  02/21/22 266 lb (120.7 kg)  02/07/22 276 lb (125.2 kg)     GEN: Patient is in no acute distress HEENT: Normal NECK: No JVD; No carotid bruits LYMPHATICS: No lymphadenopathy CARDIAC: Hear sounds regular, 2/6 systolic murmur at the apex. RESPIRATORY:  Clear to auscultation without rales, wheezing or rhonchi  ABDOMEN: Soft, non-tender, non-distended MUSCULOSKELETAL:  No edema; No deformity  SKIN: Warm and dry NEUROLOGIC:  Alert and oriented x 3 PSYCHIATRIC:  Normal affect   Signed, 04/09/22, MD  04/22/2022 2:46 PM     [facility]

## 2022-05-18 ENCOUNTER — Other Ambulatory Visit (HOSPITAL_BASED_OUTPATIENT_CLINIC_OR_DEPARTMENT_OTHER): Payer: Self-pay

## 2022-05-18 ENCOUNTER — Telehealth: Payer: Self-pay | Admitting: Cardiology

## 2022-05-18 DIAGNOSIS — I10 Essential (primary) hypertension: Secondary | ICD-10-CM

## 2022-05-18 DIAGNOSIS — E785 Hyperlipidemia, unspecified: Secondary | ICD-10-CM

## 2022-05-18 DIAGNOSIS — E119 Type 2 diabetes mellitus without complications: Secondary | ICD-10-CM

## 2022-05-18 MED ORDER — ROSUVASTATIN CALCIUM 20 MG PO TABS
20.0000 mg | ORAL_TABLET | Freq: Every day | ORAL | 2 refills | Status: DC
  Filled 2022-05-18: qty 90, 90d supply, fill #0
  Filled 2022-08-09: qty 90, 90d supply, fill #1
  Filled 2022-11-07: qty 90, 90d supply, fill #2

## 2022-05-18 MED ORDER — FENOFIBRATE 145 MG PO TABS
145.0000 mg | ORAL_TABLET | Freq: Every day | ORAL | 2 refills | Status: DC
  Filled 2022-05-18: qty 90, 90d supply, fill #0
  Filled 2022-08-09: qty 90, 90d supply, fill #1
  Filled 2022-11-07: qty 90, 90d supply, fill #2

## 2022-05-18 NOTE — Telephone Encounter (Signed)
Refills of Fenofibrate 145 mg and Rosuvastatin 20 mg sent to [facility].

## 2022-05-18 NOTE — Telephone Encounter (Signed)
 *  STAT* If patient is at the pharmacy, call can be transferred to refill team.   1. Which medications need to be refilled? (please list name of each medication and dose if known)   fenofibrate (TRICOR) 145 MG tablet    rosuvastatin (CRESTOR) 20 MG tablet    2. Which pharmacy/location (including street and city if local pharmacy) is medication to be sent to?  [place]    3. Do they need a 30 day or 90 day supply? 90 days   Pt needs refills till his appt with Dr. Geraldo Pitter in September

## 2022-05-24 ENCOUNTER — Other Ambulatory Visit (HOSPITAL_BASED_OUTPATIENT_CLINIC_OR_DEPARTMENT_OTHER): Payer: Self-pay

## 2022-05-31 ENCOUNTER — Other Ambulatory Visit (HOSPITAL_BASED_OUTPATIENT_CLINIC_OR_DEPARTMENT_OTHER): Payer: Self-pay

## 2022-06-01 ENCOUNTER — Other Ambulatory Visit (HOSPITAL_BASED_OUTPATIENT_CLINIC_OR_DEPARTMENT_OTHER): Payer: Self-pay

## 2022-06-01 MED ORDER — METFORMIN HCL 1000 MG PO TABS
1000.0000 mg | ORAL_TABLET | Freq: Every day | ORAL | 4 refills | Status: DC
  Filled 2022-06-01: qty 90, 90d supply, fill #0
  Filled 2022-08-23: qty 90, 90d supply, fill #1
  Filled 2022-11-21 – 2022-12-02 (×2): qty 90, 90d supply, fill #2
  Filled 2023-03-02: qty 90, 90d supply, fill #3
  Filled 2023-05-31: qty 90, 90d supply, fill #4

## 2022-06-21 ENCOUNTER — Other Ambulatory Visit (HOSPITAL_BASED_OUTPATIENT_CLINIC_OR_DEPARTMENT_OTHER): Payer: Self-pay

## 2022-06-21 ENCOUNTER — Other Ambulatory Visit: Payer: Self-pay | Admitting: Cardiology

## 2022-06-21 MED ORDER — CLOPIDOGREL BISULFATE 75 MG PO TABS
75.0000 mg | ORAL_TABLET | Freq: Every day | ORAL | 2 refills | Status: DC
  Filled 2022-06-21: qty 90, 90d supply, fill #0
  Filled 2022-09-19: qty 90, 90d supply, fill #1
  Filled 2022-12-16 – 2022-12-30 (×2): qty 90, 90d supply, fill #2

## 2022-06-22 ENCOUNTER — Other Ambulatory Visit: Payer: Self-pay

## 2022-06-24 ENCOUNTER — Other Ambulatory Visit (HOSPITAL_BASED_OUTPATIENT_CLINIC_OR_DEPARTMENT_OTHER): Payer: Self-pay

## 2022-07-21 DIAGNOSIS — Z125 Encounter for screening for malignant neoplasm of prostate: Secondary | ICD-10-CM | POA: Diagnosis not present

## 2022-07-21 DIAGNOSIS — E785 Hyperlipidemia, unspecified: Secondary | ICD-10-CM | POA: Diagnosis not present

## 2022-07-21 DIAGNOSIS — K760 Fatty (change of) liver, not elsewhere classified: Secondary | ICD-10-CM | POA: Diagnosis not present

## 2022-07-21 DIAGNOSIS — Z8673 Personal history of transient ischemic attack (TIA), and cerebral infarction without residual deficits: Secondary | ICD-10-CM | POA: Diagnosis not present

## 2022-07-21 DIAGNOSIS — Z0000 Encounter for general adult medical examination without abnormal findings: Secondary | ICD-10-CM | POA: Diagnosis not present

## 2022-07-21 DIAGNOSIS — I2510 Atherosclerotic heart disease of native coronary artery without angina pectoris: Secondary | ICD-10-CM | POA: Diagnosis not present

## 2022-07-21 DIAGNOSIS — E1140 Type 2 diabetes mellitus with diabetic neuropathy, unspecified: Secondary | ICD-10-CM | POA: Diagnosis not present

## 2022-07-21 DIAGNOSIS — I10 Essential (primary) hypertension: Secondary | ICD-10-CM | POA: Diagnosis not present

## 2022-07-21 DIAGNOSIS — G4733 Obstructive sleep apnea (adult) (pediatric): Secondary | ICD-10-CM | POA: Diagnosis not present

## 2022-07-21 DIAGNOSIS — I700 Atherosclerosis of aorta: Secondary | ICD-10-CM | POA: Diagnosis not present

## 2022-07-21 DIAGNOSIS — Z1389 Encounter for screening for other disorder: Secondary | ICD-10-CM | POA: Diagnosis not present

## 2022-08-18 DIAGNOSIS — I10 Essential (primary) hypertension: Secondary | ICD-10-CM | POA: Diagnosis not present

## 2022-08-19 ENCOUNTER — Telehealth: Payer: Self-pay | Admitting: Neurology

## 2022-08-19 NOTE — Telephone Encounter (Signed)
..   Pt understands that although there may be some limitations with this type of visit, we will take all precautions to reduce any security or privacy concerns.  Pt understands that this will be treated like an in office visit and we will file with pt's insurance, and there may be a patient responsible charge related to this service. ? ?

## 2022-08-22 NOTE — Progress Notes (Signed)
Virtual Visit via Video Note  I connected with Wayne Ho. on 08/22/22 at  8:15 AM EDT by a video enabled telemedicine application and verified that I am speaking with the correct person using two identifiers.  Location: Patient: in his car Provider: in the office    I discussed the limitations of evaluation and management by telemedicine and the availability of in person appointments. The patient expressed understanding and agreed to proceed.  History of Present Illness: Today August 23, 2022 SS: Here today for virtual visit. The left sided tingling to face, left hand, rib cage, inside of left leg, numbness is fairly constant, it has improved about 50%. Is not painful. Sometimes trouble with fine motor skills (buttoning sleeve). 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 He works out daily, with weight lifting, cardio. Remains on Plavix. DM doing better, last A1C 6.2  2 weeks ago, is off Trulicity, now just on Jardiance, Metformin. Has lost 120 lbs in 2 years by diet and exercise. He checks BP weekly, 141/70,  still on Diovan. On Crestor, reports cholesterol was good per primary care. Was referred for sleep study last time, he was traveling when trying to schedule. He had sleep study in the past, her wore CPAP for 1 night then returned it. Is not a good sleeper, doesn't sleep more than 5 hours a night. His snoring is less, his wife doesn't wear ear plugs anymore. He didn't have a good experience with primary sleep team in 2021. Is planning to undergo cosmetic procedure for excess skin removal.   02/21/22 Dr. Pearlean Brownie HPI: Wayne Ho is a 52 year old pleasant African-American male seen today for initial office consultation visit f for stroke.  Patient is accompanied by his wife today.  History is obtained from them and review of electronic medical records and I personally reviewed pertinent available imaging films in PACS.  Patient developed sudden onset of numbness  and tingling involving the left face, neck, fingertips on 02/07/2022.  The symptoms have persisted since then likely improved but they have never gone away.,  He denies any trouble walking, gait or balance problems.  There was no accompanying headache.  MRI scan of the brain confirmed right thalamic lacunar infarct.  CT angiogram of the brain and neck showed only minor arteriosclerotic changes.  LDL cholesterol was 52 mg%.t.  Hemoglobin A1c was 7.6.  Urine drug screen was negative.  Patient was discharged home on aspirin and Plavix.?  Tolerating well with minor bruising and no bleeding.  He states his blood pressure is better controlled at home though he admits he does not check it regularly.  Today it is 160/105 in office.  He is tolerating Crestor and Tricor well without side effects.  States his sugars are under good control down further.  He does snore and has known history of sleep apnea was prescribed CPAP mask he was unable to tolerate it in the past.  He denies any other prior history of strokes, TIAs, migraines or any other neurological problem.  Does not smoke and drinks alcohol socially.  He is working full-time as a Programmer, systems    Observations/Objective: Via virtual visit, is alert and oriented, speech is clear and concise, facial symmetry noted, is in his car, moves upper extremities well, was not ambulated  Assessment and Plan: ASSESSMENT: 52 year old African-American male with right thalamic lacunar infarct in October 2023 from small vessel disease.  Vascular risk factors diabetes, hypertension and hyperlipidemia and  mild obesity at risk for sleep apnea   -Overall doing very well, continue Plavix 75 mg daily for secondary stroke prevention  -Work on management of vascular risk factors: BP < 130/90, LDL < 70, A1C < 7.0 for secondary stroke prevention -Referral for sleep consult to r/o OSA, history in the past, has since lost 120 lbs! He saw Dr. Earl Gala in the past, would like another  consult here with our sleep team -We discussed repeat MRI of the brain, I do not feel this is indicated, for his peace of mind he would like to have repeated in October 2024 which would be 1 year out from the initial stroke, he can discuss with his PCP in order if needed -Commended on his commitment to diet and exercise  Follow Up Instructions: As needed   I discussed the assessment and treatment plan with the patient. The patient was provided an opportunity to ask questions and all were answered. The patient agreed with the plan and demonstrated an understanding of the instructions.   The patient was advised to call back or seek an in-person evaluation if the symptoms worsen or if the condition fails to improve as anticipated.   Otila Kluver, DNP  [facility] [address] [place], [place] [address] [phone]

## 2022-08-23 ENCOUNTER — Telehealth (INDEPENDENT_AMBULATORY_CARE_PROVIDER_SITE_OTHER): Payer: Commercial Managed Care - PPO | Admitting: Neurology

## 2022-08-23 DIAGNOSIS — E785 Hyperlipidemia, unspecified: Secondary | ICD-10-CM | POA: Diagnosis not present

## 2022-08-23 DIAGNOSIS — I639 Cerebral infarction, unspecified: Secondary | ICD-10-CM

## 2022-08-23 DIAGNOSIS — I6381 Other cerebral infarction due to occlusion or stenosis of small artery: Secondary | ICD-10-CM

## 2022-08-23 DIAGNOSIS — I10 Essential (primary) hypertension: Secondary | ICD-10-CM | POA: Diagnosis not present

## 2022-08-23 DIAGNOSIS — G4733 Obstructive sleep apnea (adult) (pediatric): Secondary | ICD-10-CM

## 2022-08-23 NOTE — Patient Instructions (Signed)
Great to meet you!  I will place a referral for consultation with one of our sleep doctors  Continue Plavix 75 mg daily for secondary stroke prevention  Continue to work on management of vascular risk factors for goal of BP less than 130/90, LDL less than 70, A1c less than 7.0  Keep close follow-up with your primary care doctor  See you as needed!

## 2022-09-16 DIAGNOSIS — I10 Essential (primary) hypertension: Secondary | ICD-10-CM | POA: Diagnosis not present

## 2022-09-19 ENCOUNTER — Other Ambulatory Visit (HOSPITAL_BASED_OUTPATIENT_CLINIC_OR_DEPARTMENT_OTHER): Payer: Self-pay

## 2022-09-28 ENCOUNTER — Other Ambulatory Visit (HOSPITAL_BASED_OUTPATIENT_CLINIC_OR_DEPARTMENT_OTHER): Payer: Self-pay

## 2022-09-28 MED ORDER — VALSARTAN-HYDROCHLOROTHIAZIDE 320-12.5 MG PO TABS
1.0000 | ORAL_TABLET | Freq: Every day | ORAL | 4 refills | Status: AC
  Filled 2022-09-28: qty 90, 90d supply, fill #0
  Filled 2022-12-30: qty 90, 90d supply, fill #1

## 2022-12-01 ENCOUNTER — Other Ambulatory Visit (HOSPITAL_BASED_OUTPATIENT_CLINIC_OR_DEPARTMENT_OTHER): Payer: Self-pay

## 2022-12-02 ENCOUNTER — Other Ambulatory Visit (HOSPITAL_BASED_OUTPATIENT_CLINIC_OR_DEPARTMENT_OTHER): Payer: Self-pay

## 2022-12-27 ENCOUNTER — Other Ambulatory Visit (HOSPITAL_BASED_OUTPATIENT_CLINIC_OR_DEPARTMENT_OTHER): Payer: Self-pay

## 2022-12-30 ENCOUNTER — Other Ambulatory Visit (HOSPITAL_BASED_OUTPATIENT_CLINIC_OR_DEPARTMENT_OTHER): Payer: Self-pay

## 2023-01-19 DIAGNOSIS — Z8673 Personal history of transient ischemic attack (TIA), and cerebral infarction without residual deficits: Secondary | ICD-10-CM | POA: Diagnosis not present

## 2023-01-19 DIAGNOSIS — E785 Hyperlipidemia, unspecified: Secondary | ICD-10-CM | POA: Diagnosis not present

## 2023-01-19 DIAGNOSIS — I2510 Atherosclerotic heart disease of native coronary artery without angina pectoris: Secondary | ICD-10-CM | POA: Diagnosis not present

## 2023-01-19 DIAGNOSIS — E669 Obesity, unspecified: Secondary | ICD-10-CM | POA: Diagnosis not present

## 2023-01-19 DIAGNOSIS — G4733 Obstructive sleep apnea (adult) (pediatric): Secondary | ICD-10-CM | POA: Diagnosis not present

## 2023-01-19 DIAGNOSIS — I700 Atherosclerosis of aorta: Secondary | ICD-10-CM | POA: Diagnosis not present

## 2023-01-19 DIAGNOSIS — N529 Male erectile dysfunction, unspecified: Secondary | ICD-10-CM | POA: Diagnosis not present

## 2023-01-19 DIAGNOSIS — Z1211 Encounter for screening for malignant neoplasm of colon: Secondary | ICD-10-CM | POA: Diagnosis not present

## 2023-01-19 DIAGNOSIS — E1140 Type 2 diabetes mellitus with diabetic neuropathy, unspecified: Secondary | ICD-10-CM | POA: Diagnosis not present

## 2023-01-19 LAB — LAB REPORT - SCANNED
A1c: 7.1
EGFR: 65

## 2023-02-06 ENCOUNTER — Other Ambulatory Visit: Payer: Self-pay | Admitting: Cardiology

## 2023-02-06 ENCOUNTER — Other Ambulatory Visit (HOSPITAL_BASED_OUTPATIENT_CLINIC_OR_DEPARTMENT_OTHER): Payer: Self-pay

## 2023-02-06 DIAGNOSIS — E119 Type 2 diabetes mellitus without complications: Secondary | ICD-10-CM

## 2023-02-06 DIAGNOSIS — E785 Hyperlipidemia, unspecified: Secondary | ICD-10-CM

## 2023-02-06 DIAGNOSIS — I10 Essential (primary) hypertension: Secondary | ICD-10-CM

## 2023-02-06 MED ORDER — ROSUVASTATIN CALCIUM 20 MG PO TABS
20.0000 mg | ORAL_TABLET | Freq: Every day | ORAL | 0 refills | Status: DC
  Filled 2023-02-06: qty 90, 90d supply, fill #0

## 2023-02-06 MED ORDER — FENOFIBRATE 145 MG PO TABS
145.0000 mg | ORAL_TABLET | Freq: Every day | ORAL | 0 refills | Status: DC
  Filled 2023-02-06: qty 90, 90d supply, fill #0

## 2023-02-27 ENCOUNTER — Encounter: Payer: Self-pay | Admitting: Cardiology

## 2023-02-27 ENCOUNTER — Ambulatory Visit: Payer: Commercial Managed Care - PPO | Attending: Cardiology | Admitting: Cardiology

## 2023-02-27 VITALS — BP 144/86 | HR 70 | Ht 75.0 in | Wt 267.1 lb

## 2023-02-27 DIAGNOSIS — E66811 Obesity, class 1: Secondary | ICD-10-CM

## 2023-02-27 DIAGNOSIS — E088 Diabetes mellitus due to underlying condition with unspecified complications: Secondary | ICD-10-CM

## 2023-02-27 DIAGNOSIS — I10 Essential (primary) hypertension: Secondary | ICD-10-CM | POA: Diagnosis not present

## 2023-02-27 DIAGNOSIS — E785 Hyperlipidemia, unspecified: Secondary | ICD-10-CM | POA: Diagnosis not present

## 2023-02-27 DIAGNOSIS — I2510 Atherosclerotic heart disease of native coronary artery without angina pectoris: Secondary | ICD-10-CM

## 2023-02-27 DIAGNOSIS — R0609 Other forms of dyspnea: Secondary | ICD-10-CM

## 2023-02-27 HISTORY — DX: Other forms of dyspnea: R06.09

## 2023-02-27 HISTORY — DX: Obesity, class 1: E66.811

## 2023-02-27 HISTORY — DX: Diabetes mellitus due to underlying condition with unspecified complications: E08.8

## 2023-02-27 MED ORDER — METOPROLOL TARTRATE 100 MG PO TABS: 100.0000 mg | ORAL_TABLET | Freq: Once | ORAL | 0 refills | Status: DC

## 2023-02-27 NOTE — Progress Notes (Signed)
Cardiology Office Note:    Date:  02/27/2023   ID:  Wayne Anna., DOB 01-May-1971, MRN [id number]  PCP:  Georgann Housekeeper, MD  Cardiologist:  Garwin Brothers, MD   Referring MD: Georgann Housekeeper, MD    ASSESSMENT:    1. Hyperlipidemia, unspecified hyperlipidemia type   2. Coronary artery calcification   3. Essential hypertension, benign   4. Obesity (BMI 30.0-34.9)   5. Diabetes mellitus due to underlying condition with unspecified complications (HCC)   6. Dyspnea on exertion    PLAN:    In order of problems listed above:  Coronary artery disease: Dyspnea on exertion: Secondary prevention stressed to the patient.  Importance of compliance with diet medication stressed any vocalized understanding.  In view of symptoms I discussed coronary CT angiography and he is agreeable. Essential hypertension: Blood pressure stable and diet was emphasized.  His blood pressure at home is better.  He mentioned to me the numbers.  He has an element of whitecoat hypertension. Mixed dyslipidemia: On lipid-lowering medications followed by primary care.  I discussed the reports from [facility] sheet. Diabetes mellitus: Not under the most optimal control.  Diet emphasized.  Lifestyle modification urged.  Weight reduction stressed risks of obesity explained and he promises to do better. Cardiac murmur: Echocardiogram will be done to assess murmur heard on auscultation. Patient will be seen in follow-up appointment in 6 months or earlier if the patient has any concerns.   Medication Adjustments/Labs and Tests Ordered: Current medicines are reviewed at length with the patient today.  Concerns regarding medicines are outlined above.  Orders Placed This Encounter  Procedures   EKG 12-Lead   No orders of the defined types were placed in this encounter.    No chief complaint on file.    History of Present Illness:    Wayne Wynder. is a 52 y.o. male.  Patient has past medical history of coronary  artery disease by CT reports done earlier in the past, aortic atherosclerosis, essential hypertension, mixed dyslipidemia and diabetes mellitus.  He denies any problems at this time and takes care of activities of daily living.  When he exerts himself he has some dyspnea.  At the time of my evaluation, the patient is alert awake oriented and in no distress.  Past Medical History:  Diagnosis Date   Acute stroke due to ischemia (HCC) 02/09/2022   Chronic chest pain    Coronary artery calcification 10/14/2019   Dyslipidemia    Erectile dysfunction 10/17/2014   Essential hypertension, benign 12/27/2013   GERD (gastroesophageal reflux disease)    Hyperlipidemia 12/27/2013   ASA      Lab Results  Component  Value  Date     CHOL  169  12/12/2013     HDL  31.90*  12/12/2013     LDLCALC  89  11/22/2010     LDLDIRECT  115.1  12/12/2013     TRIG  249.0*  12/12/2013     CHOLHDL  5  12/12/2013         Obesity    Obstructive sleep apnea 11/02/2007   Never followed through with testing   Rash and nonspecific skin eruption 10/17/2014   Uncontrolled type 2 diabetes mellitus with hyperglycemia, without long-term current use of insulin (HCC) 07/27/2009   Lantus 35 units. saxliptin-metformin 5-1000mg  daily     Past Surgical History:  Procedure Laterality Date   ANTERIOR CRUCIATE LIGAMENT REPAIR     both knees   CHOLECYSTECTOMY  11/07    Current Medications: Current Meds  Medication Sig   clopidogrel (PLAVIX) 75 MG tablet Take 1 tablet (75 mg total) by mouth daily.   empagliflozin (JARDIANCE) 25 MG TABS tablet Take 1 tablet by mouth once a day   empagliflozin (JARDIANCE) 25 MG TABS tablet Take 1 tablet (25 mg total) by mouth daily.   fenofibrate (TRICOR) 145 MG tablet Take 1 tablet (145 mg total) by mouth daily.   metFORMIN (GLUCOPHAGE) 1000 MG tablet Take 1 tablet (1,000 mg total) by mouth daily with supper.   metFORMIN (GLUCOPHAGE) 1000 MG tablet Take 1 tablet (1,000 mg total) by mouth daily with a  meal.   rosuvastatin (CRESTOR) 20 MG tablet Take 1 tablet (20 mg total) by mouth daily.   tadalafil (CIALIS) 20 MG tablet Take 20 mg by mouth every 36 (thirty-six) hours. As needed for ED   valsartan-hydrochlorothiazide (DIOVAN-HCT) 320-12.5 MG tablet Take 1 tablet by mouth daily.     Allergies:   Patient has no known allergies.   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  ROS:   Please see the history of present illness.    All other systems reviewed and are negative.  EKGs/Labs/Other Studies Reviewed:    The following studies were reviewed today: .Marland KitchenEKG Interpretation Date/Time:  Monday February 27 2023 08:56:46 EDT Ventricular Rate:  70 PR Interval:  170 QRS Duration:  106 QT Interval:  408 QTC Calculation: 440 R Axis:   -7  Text Interpretation: Normal sinus rhythm Incomplete right bundle branch block When compared with  ECG of 09-Feb-2022 13:06, QRS axis Shifted right Confirmed by Belva Crome [phone]) on 02/27/2023 9:04:59 AM     Recent Labs: No results found for requested labs within last 365 days.  Recent Lipid Panel    Component Value Date/Time   CHOL 113 02/10/2022 0352   CHOL 105 10/14/2019 0841   TRIG 131 02/10/2022 0352   HDL 31 (L) 02/10/2022 0352   HDL 27 (L) 10/14/2019 0841   CHOLHDL 3.6 02/10/2022 0352   VLDL 26 02/10/2022 0352   LDLCALC 56 02/10/2022 0352   LDLCALC 52 10/14/2019 0841   LDLDIRECT 115.1 12/12/2013 0816    Physical Exam:    VS:  BP (!) 144/86  Pulse 70   Ht 6\' 3"  (1.905 m)   Wt 267 lb 1.3 oz (121.1 kg)   SpO2 98%   BMI 33.38 kg/m     Wt Readings from Last 3 Encounters:  02/27/23 267 lb 1.3 oz (121.1 kg)  04/22/22 253 lb 1.3 oz (114.8 kg)  02/21/22 266 lb (120.7 kg)     GEN: Patient is in no acute distress HEENT: Normal NECK: No JVD; No carotid bruits LYMPHATICS: No lymphadenopathy CARDIAC: Hear sounds regular, 2/6 systolic murmur at the apex. RESPIRATORY:  Clear to auscultation without rales, wheezing or rhonchi  ABDOMEN: Soft, non-tender, non-distended MUSCULOSKELETAL:  No edema; No deformity  SKIN: Warm and dry NEUROLOGIC:  Alert and oriented x 3 PSYCHIATRIC:  Normal affect   Signed, Garwin Brothers, MD  02/27/2023 9:23 AM    [facility]

## 2023-02-27 NOTE — Patient Instructions (Signed)
Medication Instructions:  Your physician recommends that you continue on your current medications as directed. Please refer to the Current Medication list given to you today.   *If you need a refill on your cardiac medications before your next appointment, please call your pharmacy*   Lab Work: Your physician recommends that you have a BMP today in the office.   If you have labs (blood work) drawn today and your tests are completely normal, you will receive your results only by: MyChart Message (if you have MyChart) OR A paper copy in the mail If you have any lab test that is abnormal or we need to change your treatment, we will call you to review the results.   Testing/Procedures:   Your cardiac CT will be scheduled at one of the below locations:   [facility] [address] [place], [place] [address] [phone]  If scheduled at [facility], please arrive at the [facility] Entrance (Entrance C2) of [facility] 30 minutes prior to test start time. You can use the FREE valet parking offered at entrance C (encouraged to control the heart rate for the test)  Proceed to the [facility] Radiology Department (first floor) to check-in and test prep.  All radiology patients and guests should use entrance C2 at [facility], accessed from [facility], even though the hospital's physical address listed is [address]     Please follow these instructions carefully (unless otherwise directed):  Hold all erectile dysfunction medications at least 3 days (72 hrs) prior to test. (Ie viagra, cialis, sildenafil, tadalafil, etc) We will administer nitroglycerin during this exam.   On the Night Before the Test: Be sure to Drink plenty of water. Do not consume any caffeinated/decaffeinated beverages or chocolate 12 hours prior to your test. Do not take any antihistamines 12 hours prior to your test.  On the Day of the  Test: Drink plenty of water until 1 hour prior to the test. Do not eat any food 1 hour prior to test. You may take your regular medications prior to the test.  Take metoprolol (Lopressor) two hours prior to test. This will be a one time dose.       After the Test: Drink plenty of water. After receiving IV contrast, you may experience a mild flushed feeling. This is normal. On occasion, you may experience a mild rash up to 24 hours after the test. This is not dangerous. If this occurs, you can take Benadryl 25 mg and increase your fluid intake. If you experience trouble breathing, this can be serious. If it is severe call 911 IMMEDIATELY. If it is mild, please call our office. If you take any of these medications: Glipizide/Metformin, Avandament, Glucavance, please do not take 48 hours after completing test unless otherwise instructed.  We will call to schedule your test 2-4 weeks out understanding that some insurance companies will need an authorization prior to the service being performed.   For non-scheduling related questions, please contact the cardiac imaging nurse navigator should you have any questions/concerns: Rockwell Alexandria, Cardiac Imaging Nurse Navigator Larey Brick, Cardiac Imaging Nurse Navigator [facility] Direct Office Dial: [phone]   For scheduling needs, including cancellations and rescheduling, please call [place], [phone].   Your physician has requested that you have an echocardiogram. Echocardiography is a painless test that uses sound waves to create images of your heart. It provides your doctor with information about the  size and shape of your heart and how well your heart's chambers and valves are working. This procedure takes approximately one hour. There are no restrictions for this procedure. Please do NOT wear cologne, perfume, aftershave, or lotions (deodorant is allowed). Please arrive 15 minutes prior to your appointment  time.   Your next appointment:   9 month(s)  The format for your next appointment:   In Person  Provider:   Belva Crome, MD   Other Instructions Cardiac CT Angiogram A cardiac CT angiogram is a procedure to look at the heart and the area around the heart. It may be done to help find the cause of chest pains or other symptoms of heart disease. During this procedure, a substance called contrast dye is injected into the blood vessels in the area to be checked. A large X-ray machine, called a CT scanner, then takes detailed pictures of the heart and the surrounding area. The procedure is also sometimes called a coronary CT angiogram, coronary artery scanning, or CTA. A cardiac CT angiogram allows the health care provider to see how well blood is flowing to and from the heart. The health care provider will be able to see if there are any problems, such as: Blockage or narrowing of the coronary arteries in the heart. Fluid around the heart. Signs of weakness or disease in the muscles, valves, and tissues of the heart. Tell a health care provider about: Any allergies you have. This is especially important if you have had a previous allergic reaction to contrast dye. All medicines you are taking, including vitamins, herbs, eye drops, creams, and over-the-counter medicines. Any blood disorders you have. Any surgeries you have had. Any medical conditions you have. Whether you are pregnant or may be pregnant. Any anxiety disorders, chronic pain, or other conditions you have that may increase your stress or prevent you from lying still. What are the risks? Generally, this is a safe procedure. However, problems may occur, including: Bleeding. Infection. Allergic reactions to medicines or dyes. Damage to other structures or organs. Kidney damage from the contrast dye that is used. Increased risk of cancer from radiation exposure. This risk is low. Talk with your health care provider  about: The risks and benefits of testing. How you can receive the lowest dose of radiation. What happens before the procedure? Wear comfortable clothing and remove any jewelry, glasses, dentures, and hearing aids. Follow instructions from your health care provider about eating and drinking. This may include: For 12 hours before the procedure -- avoid caffeine. This includes tea, coffee, soda, energy drinks, and diet pills. Drink plenty of water or other fluids that do not have caffeine in them. Being well hydrated can prevent complications. For 4-6 hours before the procedure -- stop eating and drinking. The contrast dye can cause nausea, but this is less likely if your stomach is empty. Ask your health care provider about changing or stopping your regular medicines. This is especially important if you are taking diabetes medicines, blood thinners, or medicines to treat problems with erections (erectile dysfunction). What happens during the procedure?  Hair on your chest may need to be removed so that small sticky patches called electrodes can be placed on your chest. These will transmit information that helps to monitor your heart during the procedure. An IV will be inserted into one of your veins. You might be given a medicine to control your heart rate during the procedure. This will help to ensure that good images are obtained.  You will be asked to lie on an exam table. This table will slide in and out of the CT machine during the procedure. Contrast dye will be injected into the IV. You might feel warm, or you may get a metallic taste in your mouth. You will be given a medicine called nitroglycerin. This will relax or dilate the arteries in your heart. The table that you are lying on will move into the CT machine tunnel for the scan. The person running the machine will give you instructions while the scans are being done. You may be asked to: Keep your arms above your head. Hold your  breath. Stay very still, even if the table is moving. When the scanning is complete, you will be moved out of the machine. The IV will be removed. The procedure may vary among health care providers and hospitals. What can I expect after the procedure? After your procedure, it is common to have: A metallic taste in your mouth from the contrast dye. A feeling of warmth. A headache from the nitroglycerin. Follow these instructions at home: Take over-the-counter and prescription medicines only as told by your health care provider. If you are told, drink enough fluid to keep your urine pale yellow. This will help to flush the contrast dye out of your body. Most people can return to their normal activities right after the procedure. Ask your health care provider what activities are safe for you. It is up to you to get the results of your procedure. Ask your health care provider, or the department that is doing the procedure, when your results will be ready. Keep all follow-up visits as told by your health care provider. This is important. Contact a health care provider if: You have any symptoms of allergy to the contrast dye. These include: Shortness of breath. Rash or hives. A racing heartbeat. Summary A cardiac CT angiogram is a procedure to look at the heart and the area around the heart. It may be done to help find the cause of chest pains or other symptoms of heart disease. During this procedure, a large X-ray machine, called a CT scanner, takes detailed pictures of the heart and the surrounding area after a contrast dye has been injected into blood vessels in the area. Ask your health care provider about changing or stopping your regular medicines before the procedure. This is especially important if you are taking diabetes medicines, blood thinners, or medicines to treat erectile dysfunction. If you are told, drink enough fluid to keep your urine pale yellow. This will help to flush the  contrast dye out of your body. This information is not intended to replace advice given to you by your health care provider. Make sure you discuss any questions you have with your health care provider. Document Revised: 12/19/2018 Document Reviewed: 12/19/2018 Elsevier Patient Education  [employer].  Echocardiogram An echocardiogram is a test that uses sound waves to make images of your heart. This way of making images is often called ultrasound. The images from this test can help find out many things about your heart, including: The size and shape of your heart. The strength of your heart muscle and how well it's working. The size, thickness, and movement of your heart's walls. How your heart valves are working. Problems such as: A tumor or a growth from an infection around the heart valves. Areas of heart muscle that aren't working well because of poor blood flow or injury from a heart attack.  An aneurysm. This is a weak or damaged part of an artery wall. An artery is a blood vessel. Tell a health care provider about: Any allergies you have. All medicines you're taking, including vitamins, herbs, eye drops, creams, and over-the-counter medicines. Any bleeding problems you have. Any surgeries you've had. Any medical problems you have. Whether you're pregnant or may be pregnant. What are the risks? Your health care provider will talk with you about risks. These may include an allergic reaction to IV dye that may be used during the test. What happens before the test? You don't need to do anything to get ready for this test. You may eat and drink normally. What happens during the test?  You'll take off your clothes from the waist up and put on a hospital gown. Sticky patches called electrodes may be placed on your chest. These will be connected to a machine that monitors your heart rate and rhythm. You'll lie down on a table for the exam. A wand covered in gel will be moved over  your chest. Sound waves from the wand will go to your heart and bounce back--or "echo" back. The sound waves will go to a computer that uses them to make images of your heart. The images can be viewed on a monitor. The images will also be recorded on the computer so your provider can look at them later. You may be asked to change positions or hold your breath for a short time. This makes it easier to get different views or better views of your heart. In some cases, you may be given a dye through an IV. The IV is put into one of your veins. This dye can make the areas of your heart easier to see. The procedure may vary among providers and hospitals. What can I expect after the test? You may return to your normal diet, activities, and medicines unless your provider tells you not to. If an IV was placed for the test, it will be removed. It's up to you to get the results of your test. Ask your provider, or the department that's doing the test, when your results will be ready. This information is not intended to replace advice given to you by your health care provider. Make sure you discuss any questions you have with your health care provider. Document Revised: 06/24/2022 Document Reviewed: 06/24/2022 Elsevier Patient Education  2024 [employer].

## 2023-02-28 LAB — BASIC METABOLIC PANEL
BUN/Creatinine Ratio: 19 (ref 9–20)
BUN: 22 mg/dL (ref 6–24)
CO2: 22 mmol/L (ref 20–29)
Calcium: 10 mg/dL (ref 8.7–10.2)
Chloride: 99 mmol/L (ref 96–106)
Creatinine, Ser: 1.18 mg/dL (ref 0.76–1.27)
Glucose: 280 mg/dL — ABNORMAL HIGH (ref 70–99)
Potassium: 5 mmol/L (ref 3.5–5.2)
Sodium: 137 mmol/L (ref 134–144)
eGFR: 73 mL/min/{1.73_m2} (ref >59)

## 2023-03-03 ENCOUNTER — Ambulatory Visit (HOSPITAL_BASED_OUTPATIENT_CLINIC_OR_DEPARTMENT_OTHER)
Admission: RE | Admit: 2023-03-03 | Discharge: 2023-03-03 | Disposition: A | Discharge status: Home / Self Care | Payer: Commercial Managed Care - PPO | Source: Ambulatory Visit | Attending: Cardiology | Admitting: Cardiology

## 2023-03-03 DIAGNOSIS — I2510 Atherosclerotic heart disease of native coronary artery without angina pectoris: Secondary | ICD-10-CM | POA: Insufficient documentation

## 2023-03-03 DIAGNOSIS — R0609 Other forms of dyspnea: Secondary | ICD-10-CM | POA: Insufficient documentation

## 2023-03-03 LAB — ECHOCARDIOGRAM COMPLETE
AR max vel: 3.38 cm2
AV Area VTI: 3.56 cm2
AV Area mean vel: 3.5 cm2
AV Mean grad: 4 mm[Hg]
AV Peak grad: 6.4 mm[Hg]
Ao pk vel: 1.26 m/s
Area-P 1/2: 3.02 cm2
Calc EF: 64.1 %
S' Lateral: 3.1 cm
Single Plane A2C EF: 60.6 %
Single Plane A4C EF: 67.5 %

## 2023-03-06 ENCOUNTER — Telehealth: Payer: Self-pay

## 2023-03-06 NOTE — Telephone Encounter (Signed)
Elevated glucose other wise normal labs.  Echo normal

## 2023-03-06 NOTE — Telephone Encounter (Signed)
-----   Message from Aundra Dubin Revankar sent at 03/02/2023  7:44 AM EDT ----- The results of the study is unremarkable. Please inform patient. I will discuss in detail at next appointment. Cc  primary care/referring physician Garwin Brothers, MD 03/02/2023 7:44 AM

## 2023-03-09 ENCOUNTER — Telehealth (HOSPITAL_COMMUNITY): Payer: Self-pay | Admitting: Emergency Medicine

## 2023-03-09 ENCOUNTER — Encounter (HOSPITAL_COMMUNITY): Payer: Self-pay

## 2023-03-09 NOTE — Telephone Encounter (Signed)
Attempted to call patient regarding upcoming cardiac CT appointment. °Left message on voicemail with name and callback number °Dwan Fennel RN Navigator Cardiac Imaging °[facility] °[phone] Office °[phone] Cell ° °

## 2023-03-13 ENCOUNTER — Ambulatory Visit (HOSPITAL_COMMUNITY): Payer: Commercial Managed Care - PPO

## 2023-03-22 ENCOUNTER — Telehealth (HOSPITAL_COMMUNITY): Payer: Self-pay | Admitting: *Deleted

## 2023-03-22 NOTE — Telephone Encounter (Signed)
Attempted to call patient regarding upcoming cardiac CT appointment. Voicemail box full. Unable to leave a message.  Gordy Clement RN Navigator Cardiac Imaging [facility] [phone] Office [phone] Cell

## 2023-03-22 NOTE — Telephone Encounter (Signed)
Patient returning call upcoming cardiac imaging study; pt verbalizes understanding of appt date/time, parking situation and where to check in, pre-test NPO status and medications ordered, and verified current allergies; name and call back number provided for further questions should they arise  Larey Brick RN Navigator Cardiac Imaging [facility] [phone] office [phone] cell  Patient aware to avoid taking cialis until after his CCTA. He will take 100mg  metoprolol tartrate two hours prior to his cardiac CT scan and will arrive at 2:30 PM.

## 2023-03-23 ENCOUNTER — Other Ambulatory Visit (HOSPITAL_BASED_OUTPATIENT_CLINIC_OR_DEPARTMENT_OTHER): Payer: Self-pay

## 2023-03-23 ENCOUNTER — Other Ambulatory Visit: Payer: Self-pay | Admitting: Cardiology

## 2023-03-23 MED ORDER — JARDIANCE 25 MG PO TABS
25.0000 mg | ORAL_TABLET | Freq: Every day | ORAL | 4 refills | Status: AC
  Filled 2023-03-23 – 2023-09-21 (×2): qty 90, 90d supply, fill #0
  Filled 2024-02-20: qty 90, 90d supply, fill #1

## 2023-03-23 MED ORDER — CLOPIDOGREL BISULFATE 75 MG PO TABS: 75.0000 mg | ORAL_TABLET | Freq: Every day | ORAL | 2 refills | Status: AC

## 2023-03-24 ENCOUNTER — Ambulatory Visit (HOSPITAL_BASED_OUTPATIENT_CLINIC_OR_DEPARTMENT_OTHER)
Admission: RE | Admit: 2023-03-24 | Discharge: 2023-03-24 | Disposition: A | Discharge status: Home / Self Care | Payer: Commercial Managed Care - PPO | Source: Ambulatory Visit | Attending: Cardiovascular Disease | Admitting: Cardiovascular Disease

## 2023-03-24 ENCOUNTER — Other Ambulatory Visit: Payer: Self-pay | Admitting: Cardiovascular Disease

## 2023-03-24 ENCOUNTER — Ambulatory Visit (HOSPITAL_COMMUNITY)
Admission: RE | Admit: 2023-03-24 | Discharge: 2023-03-24 | Disposition: A | Discharge status: Home / Self Care | Payer: Commercial Managed Care - PPO | Source: Ambulatory Visit | Attending: Cardiology | Admitting: Cardiology

## 2023-03-24 DIAGNOSIS — I2510 Atherosclerotic heart disease of native coronary artery without angina pectoris: Secondary | ICD-10-CM | POA: Diagnosis not present

## 2023-03-24 DIAGNOSIS — R0609 Other forms of dyspnea: Secondary | ICD-10-CM | POA: Insufficient documentation

## 2023-03-24 DIAGNOSIS — R931 Abnormal findings on diagnostic imaging of heart and coronary circulation: Secondary | ICD-10-CM | POA: Diagnosis not present

## 2023-03-24 MED ORDER — METOPROLOL TARTRATE 5 MG/5ML IV SOLN
INTRAVENOUS | Status: AC
  Filled 2023-03-24: qty 10

## 2023-03-24 MED ORDER — NITROGLYCERIN 0.4 MG SL SUBL
SUBLINGUAL_TABLET | SUBLINGUAL | Status: AC
  Filled 2023-03-24: qty 2

## 2023-03-24 MED ORDER — METOPROLOL TARTRATE 5 MG/5ML IV SOLN
5.0000 mg | Freq: Once | INTRAVENOUS | Status: AC
  Administered 2023-03-24: 5 mg via INTRAVENOUS

## 2023-03-24 MED ORDER — NITROGLYCERIN 0.4 MG SL SUBL
0.8000 mg | SUBLINGUAL_TABLET | Freq: Once | SUBLINGUAL | Status: AC
Start: 2023-03-24 — End: 2023-03-24
  Administered 2023-03-24: 0.8 mg via SUBLINGUAL

## 2023-03-24 MED ORDER — IOHEXOL 350 MG/ML SOLN
100.0000 mL | Freq: Once | INTRAVENOUS | Status: AC | PRN
  Administered 2023-03-24: 100 mL via INTRAVENOUS

## 2023-03-29 ENCOUNTER — Telehealth: Payer: Self-pay

## 2023-03-29 ENCOUNTER — Other Ambulatory Visit: Payer: Self-pay

## 2023-03-29 MED ORDER — NITROGLYCERIN 0.4 MG SL SUBL: 0.4000 mg | SUBLINGUAL_TABLET | SUBLINGUAL | 6 refills | Status: DC | PRN

## 2023-03-29 NOTE — Telephone Encounter (Signed)
-----   Message from Garwin Brothers sent at 03/29/2023 11:32 AM EST ----- Make sure patient has nitroglycerin.  I need to see him preferably today or tomorrow.  Tell him also that radiology report is still pending and if he does not hear from [place] in 2 weeks to let us know.  Needs to go to ER for chest pain. Garwin Brothers, MD 03/29/2023 11:31 AM

## 2023-03-29 NOTE — Telephone Encounter (Signed)
Results reviewed with pt as per Dr. Kem Parkinson note.  Pt verbalized understanding and had no additional questions. Routed to PCP. Appt made  NTG sent and pt aware no ED medication within 72 hours of NTG.

## 2023-03-30 ENCOUNTER — Encounter: Payer: Self-pay | Admitting: Cardiology

## 2023-03-30 ENCOUNTER — Ambulatory Visit: Payer: Commercial Managed Care - PPO | Attending: Cardiology | Admitting: Cardiology

## 2023-03-30 VITALS — BP 156/82 | HR 69 | Ht 75.0 in | Wt 264.1 lb

## 2023-03-30 DIAGNOSIS — I10 Essential (primary) hypertension: Secondary | ICD-10-CM | POA: Diagnosis not present

## 2023-03-30 DIAGNOSIS — I2510 Atherosclerotic heart disease of native coronary artery without angina pectoris: Secondary | ICD-10-CM | POA: Diagnosis not present

## 2023-03-30 MED ORDER — RANOLAZINE ER 500 MG PO TB12: 500.0000 mg | ORAL_TABLET | Freq: Two times a day (BID) | ORAL | 0 refills | Status: AC

## 2023-03-30 MED ORDER — RANOLAZINE ER 1000 MG PO TB12: 1000.0000 mg | ORAL_TABLET | Freq: Two times a day (BID) | ORAL | 3 refills | Status: AC

## 2023-03-30 NOTE — Patient Instructions (Signed)
Medication Instructions:  Your physician has recommended you make the following change in your medication:   Start Ranexa 500 mg twice daily for 2 weeks then increase to 1000 mg twice daily.  *If you need a refill on your cardiac medications before your next appointment, please call your pharmacy*   Lab Work: None ordered If you have labs (blood work) drawn today and your tests are completely normal, you will receive your results only by: MyChart Message (if you have MyChart) OR A paper copy in the mail If you have any lab test that is abnormal or we need to change your treatment, we will call you to review the results.   Testing/Procedures: You are scheduled for a Myocardial Perfusion Imaging Study.  Please arrive 15 minutes prior to your appointment time for registration and insurance purposes.  The test will take approximately 3 to 4 hours to complete; you may bring reading material.  If someone comes with you to your appointment, they will need to remain in the main lobby due to limited space in the testing area.   How to prepare for your Myocardial Perfusion Test: Do not eat or drink 3 hours prior to your test, except you may have water. Do not consume products containing caffeine (regular or decaffeinated) 12 hours prior to your test. (ex: coffee, chocolate, sodas, tea). Do bring a list of your current medications with you.  If not listed below, you may take your medications as normal. Do wear comfortable clothes (no dresses or overalls) and walking shoes, tennis shoes preferred (No heels or open toe shoes are allowed). Do NOT wear cologne, perfume, aftershave, or lotions (deodorant is allowed). If these instructions are not followed, your test will have to be rescheduled.  If you cannot keep your appointment, please provide 24 hours notification to the Nuclear Lab, to avoid a possible $50 charge to your account.  Follow-Up: At [facility], you and your health needs  are our priority.  As part of our continuing mission to provide you with exceptional heart care, we have created designated Provider Care Teams.  These Care Teams include your primary Cardiologist (physician) and Advanced Practice Providers (APPs -  Physician Assistants and Nurse Practitioners) who all work together to provide you with the care you need, when you need it.  We recommend signing up for the patient portal called "MyChart".  Sign up information is provided on this After Visit Summary.  MyChart is used to connect with patients for Virtual Visits (Telemedicine).  Patients are able to view lab/test results, encounter notes, upcoming appointments, etc.  Non-urgent messages can be sent to your provider as well.   To learn more about what you can do with MyChart, go to [email or web address].    Your next appointment:   1 month(s)  Provider:   Belva Crome, MD   Other Instructions  Cardiac Nuclear Scan A cardiac nuclear scan is a test that is done to check the flow of blood to your heart. It is done when you are resting and when you are exercising. The test looks for problems such as: Not enough blood reaching a portion of the heart. The heart muscle not working as it should. You may need this test if you have: Heart disease. Lab results that are not normal. Had heart surgery or a balloon procedure to open up blocked arteries (angioplasty) or a small mesh tube (stent). Chest pain. Shortness of breath. Had a heart attack. In this test, a special dye (  tracer) is put into your bloodstream. The tracer will travel to your heart. A camera will then take pictures of your heart to see how the tracer moves through your heart. This test is usually done at a hospital and takes 2-4 hours. Tell a doctor about: Any allergies you have. All medicines you are taking, including vitamins, herbs, eye drops, creams, and over-the-counter medicines. Any bleeding problems you have. Any surgeries you  have had. Any medical conditions you have. Whether you are pregnant or may be pregnant. Any history of asthma or long-term (chronic) lung disease. Any history of heart rhythm disorders or heart valve conditions. What are the risks? Your doctor will talk with you about risks. These may include: Serious chest pain and heart attack. This is only a risk if the stress portion of the test is done. Fast or uneven heartbeats (palpitations). A feeling of warmth in your chest. This feeling usually does not last long. Allergic reaction to the tracer. Shortness of breath or trouble breathing. What happens before the test? Ask your doctor about changing or stopping your normal medicines. Follow instructions from your doctor about what you cannot eat or drink. Remove your jewelry on the day of the test. Ask your doctor if you need to avoid nicotine or caffeine. What happens during the test? An IV tube will be inserted into one of your veins. Your doctor will give you a small amount of tracer through the IV tube. You will wait for 20-40 minutes while the tracer moves through your bloodstream. Your heart will be monitored with an electrocardiogram (ECG). You will lie down on an exam table. Pictures of your heart will be taken for about 15-20 minutes. You may also have a stress test. For this test, one of these things may be done: You will be asked to exercise on a treadmill or a stationary bike. You will be given medicines that will make your heart work harder. This is done if you are unable to exercise. When blood flow to your heart has peaked, a tracer will again be given through the IV tube. After 20-40 minutes, you will get back on the exam table. More pictures will be taken of your heart. Depending on the tracer that is used, more pictures may need to be taken 3-4 hours later. Your IV tube will be removed when the test is over. The test may vary among doctors and hospitals. What happens after  the test? Ask your doctor: Whether you can return to your normal schedule, including diet, activities, travel, and medicines. Whether you should drink more fluids. This will help to remove the tracer from your body. Ask your doctor, or the department that is doing the test: When will my results be ready? How will I get my results? What are my treatment options? What other tests do I need? What are my next steps? This information is not intended to replace advice given to you by your health care provider. Make sure you discuss any questions you have with your health care provider. Document Revised: 09/21/2021 Document Reviewed: 09/21/2021 Elsevier Patient Education  2023 [employer].

## 2023-03-30 NOTE — Progress Notes (Signed)
Cardiology Office Note:    Date:  03/30/2023   ID:  Wayne Anna., DOB 07-Aug-1970, MRN [id number]  PCP:  Wayne Housekeeper, MD  Cardiologist:  Wayne Brothers, MD   Referring MD: Wayne Housekeeper, MD    ASSESSMENT:    1. Coronary artery calcification    PLAN:    In order of problems listed above:  Abnormal CT coronary angiography: Coronary artery disease: I discussed the report with the patient at extensive length.  I discussed coronary angiography and left heart catheterization, procedure, benefits and potential risks.  He is not keen on it.  He tells me he is asymptomatic.  I told him about sublingual nitroglycerin use on a as needed basis.  Also discussed interactions with medication such as Cialis and he understands and questions were answered to satisfaction.  He wants to try medical therapy.  I initiated Ranexa 500 mg twice daily for 2 weeks and then 1000 mg twice daily after that.  I will also put him in for exercise stress Cardiolite to assess for objective evidence of any ischemic substrate.  He is agreeable.  He knows to go to the nearest emergency room for any concerning symptoms. Essential hypertension: Blood pressure stable and diet was emphasized.  Lifestyle modification urged.  He mentions to me that his blood pressures are fine at home.  He mentioned to me the numbers. Mixed dyslipidemia: On lipid-lowering medications and lipids were reviewed with him.  Diet emphasized.  Weight reduction stressed. Diabetes mellitus: Managed by primary care.  Diet emphasized. Patient will be seen in follow-up appointment in 4 weeks or earlier if the patient has any concerns.    Medication Adjustments/Labs and Tests Ordered: Current medicines are reviewed at length with the patient today.  Concerns regarding medicines are outlined above.  Orders Placed This Encounter  Procedures   EKG 12-Lead   No orders of the defined types were placed in this encounter.    No chief complaint on  file.    History of Present Illness:    Wayne Vandyken. is a 52 y.o. male.  Patient has past medical history of recently diagnosed coronary artery disease, history of stroke, essential hypertension dyslipidemia and diabetes mellitus.  He denies any problems at this time and takes care of activities of daily living.  No chest pain orthopnea or PND.  His CT coronary angiography revealed significant coronary artery disease.  The patient mentions to me that he has great effort tolerance and has no symptoms whatsoever.  He can walk 30 to 40 minutes at the gym on the treadmill without any symptoms.  At the time of my evaluation, the patient is alert awake oriented and in no distress.  Past Medical History:  Diagnosis Date   Acute stroke due to ischemia (HCC) 02/09/2022   Chronic chest pain    Coronary artery calcification 10/14/2019   Diabetes mellitus due to underlying condition with unspecified complications (HCC) 02/27/2023   Dyslipidemia    Dyspnea on exertion 02/27/2023   Erectile dysfunction 10/17/2014   Essential hypertension, benign 12/27/2013   GERD (gastroesophageal reflux disease)    Hyperlipidemia 12/27/2013   ASA      Lab Results  Component  Value  Date     CHOL  169  12/12/2013     HDL  31.90*  12/12/2013     LDLCALC  89  11/22/2010     LDLDIRECT  115.1  12/12/2013     TRIG  249.0*  12/12/2013  CHOLHDL  5  12/12/2013         Obesity    Obesity (BMI 30.0-34.9) 02/27/2023   Obstructive sleep apnea 11/02/2007   Never followed through with testing   Rash and nonspecific skin eruption 10/17/2014   Uncontrolled type 2 diabetes mellitus with hyperglycemia, without long-term current use of insulin (HCC) 07/27/2009   Lantus 35 units. saxliptin-metformin 5-1000mg  daily     Past Surgical History:  Procedure Laterality Date   ANTERIOR CRUCIATE LIGAMENT REPAIR     both knees   CHOLECYSTECTOMY  11/07    Current Medications: Current Meds  Medication Sig   clopidogrel (PLAVIX) 75  MG tablet Take 1 tablet (75 mg total) by mouth daily.   Dulaglutide (TRULICITY) 3 MG/0.5ML SOPN Inject one pen under the skin once a week as directed   empagliflozin (JARDIANCE) 25 MG TABS tablet Take 1 tablet by mouth once a day   empagliflozin (JARDIANCE) 25 MG TABS tablet Take 1 tablet (25 mg total) by mouth daily.   fenofibrate (TRICOR) 145 MG tablet Take 1 tablet (145 mg total) by mouth daily.   metFORMIN (GLUCOPHAGE) 1000 MG tablet Take 1 tablet (1,000 mg total) by mouth daily with supper.   metFORMIN (GLUCOPHAGE) 1000 MG tablet Take 1 tablet (1,000 mg total) by mouth daily with a meal.   nitroGLYCERIN (NITROSTAT) 0.4 MG SL tablet Place 0.4 mg under the tongue every 5 (five) minutes as needed for chest pain.   pantoprazole (PROTONIX) 20 MG tablet Take 1 tablet (20 mg total) by mouth daily.   rosuvastatin (CRESTOR) 20 MG tablet Take 1 tablet (20 mg total) by mouth daily.   sildenafil (VIAGRA) 100 MG tablet Take 0.5-1 tablets (50-100 mg total) by mouth daily as needed for erectile dysfunction.   tadalafil (CIALIS) 20 MG tablet Take 20 mg by mouth every 36 (thirty-six) hours. As needed for ED   valsartan-hydrochlorothiazide (DIOVAN-HCT) 320-12.5 MG tablet Take 1 tablet by mouth daily.     Allergies:   Patient has no known allergies.   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  ROS:   Please see the history of present illness.    All other systems reviewed and are negative.  EKGs/Labs/Other Studies Reviewed:    The  following studies were reviewed today: I discussed coronary angiography report with the patient at length   Recent Labs: 02/27/2023: BUN 22; Creatinine, Ser 1.18; Potassium 5.0; Sodium 137  Recent Lipid Panel    Component Value Date/Time   CHOL 113 02/10/2022 0352   CHOL 105 10/14/2019 0841   TRIG 131 02/10/2022 0352   HDL 31 (L) 02/10/2022 0352   HDL 27 (L) 10/14/2019 0841   CHOLHDL 3.6 02/10/2022 0352   VLDL 26 02/10/2022 0352   LDLCALC 56 02/10/2022 0352   LDLCALC 52 10/14/2019 0841   LDLDIRECT 115.1 12/12/2013 0816    Physical Exam:    VS:  BP (!) 156/82   Pulse 69   Ht 6\' 3"  (1.905 m)   Wt 264 lb 1.9 oz (119.8 kg)   SpO2 97%   BMI 33.01 kg/m     Wt Readings from Last 3 Encounters:  03/30/23 264 lb 1.9 oz (119.8 kg)  02/27/23 267 lb 1.3 oz (121.1 kg)  04/22/22 253 lb 1.3 oz (114.8 kg)     GEN: Patient is in no acute distress HEENT: Normal NECK: No JVD; No carotid bruits LYMPHATICS: No lymphadenopathy CARDIAC: Hear sounds regular, 2/6 systolic murmur at the apex. RESPIRATORY:  Clear to auscultation without rales, wheezing or rhonchi  ABDOMEN: Soft, non-tender, non-distended MUSCULOSKELETAL:  No edema; No deformity  SKIN: Warm and dry NEUROLOGIC:  Alert and oriented x 3 PSYCHIATRIC:  Normal affect   Signed, Wayne Brothers, MD  03/30/2023 2:47 PM    [facility]

## 2023-04-03 ENCOUNTER — Other Ambulatory Visit (HOSPITAL_BASED_OUTPATIENT_CLINIC_OR_DEPARTMENT_OTHER): Payer: Self-pay

## 2023-04-13 ENCOUNTER — Encounter (HOSPITAL_COMMUNITY): Payer: Self-pay

## 2023-04-14 ENCOUNTER — Ambulatory Visit (HOSPITAL_COMMUNITY): Payer: Commercial Managed Care - PPO | Attending: Cardiology

## 2023-04-14 DIAGNOSIS — I2510 Atherosclerotic heart disease of native coronary artery without angina pectoris: Secondary | ICD-10-CM | POA: Insufficient documentation

## 2023-04-14 LAB — MYOCARDIAL PERFUSION IMAGING
Angina Index: 0
Duke Treadmill Score: 9
Estimated workload: 10.1
Exercise duration (min): 9 min
Exercise duration (sec): 0 s
LV dias vol: 86 mL (ref 62–150)
LV sys vol: 40 mL
MPHR: 166 {beats}/min
Nuc Stress EF: 53 %
Peak HR: 151 {beats}/min
Percent HR: 90 %
Rest HR: 78 {beats}/min
Rest Nuclear Isotope Dose: 9.7 mCi
SDS: 0
SRS: 0
SSS: 0
ST Depression (mm): 0 mm
Stress Nuclear Isotope Dose: 31.6 mCi
TID: 0.99

## 2023-04-14 MED ORDER — TECHNETIUM TC 99M TETROFOSMIN IV KIT
9.7000 | PACK | Freq: Once | INTRAVENOUS | Status: AC | PRN
  Administered 2023-04-14: 9.7 via INTRAVENOUS

## 2023-04-14 MED ORDER — TECHNETIUM TC 99M TETROFOSMIN IV KIT
31.6000 | PACK | Freq: Once | INTRAVENOUS | Status: AC | PRN
  Administered 2023-04-14: 31.6 via INTRAVENOUS

## 2023-04-17 DIAGNOSIS — E785 Hyperlipidemia, unspecified: Secondary | ICD-10-CM | POA: Diagnosis not present

## 2023-04-17 DIAGNOSIS — Z6832 Body mass index (BMI) 32.0-32.9, adult: Secondary | ICD-10-CM | POA: Diagnosis not present

## 2023-04-17 DIAGNOSIS — G4733 Obstructive sleep apnea (adult) (pediatric): Secondary | ICD-10-CM | POA: Diagnosis not present

## 2023-04-17 DIAGNOSIS — Z8673 Personal history of transient ischemic attack (TIA), and cerebral infarction without residual deficits: Secondary | ICD-10-CM | POA: Diagnosis not present

## 2023-04-17 DIAGNOSIS — E66811 Obesity, class 1: Secondary | ICD-10-CM | POA: Diagnosis not present

## 2023-04-17 DIAGNOSIS — E1140 Type 2 diabetes mellitus with diabetic neuropathy, unspecified: Secondary | ICD-10-CM | POA: Diagnosis not present

## 2023-04-17 DIAGNOSIS — I10 Essential (primary) hypertension: Secondary | ICD-10-CM | POA: Diagnosis not present

## 2023-04-20 ENCOUNTER — Other Ambulatory Visit: Payer: Self-pay

## 2023-04-26 ENCOUNTER — Ambulatory Visit: Payer: Commercial Managed Care - PPO | Attending: Cardiology | Admitting: Cardiology

## 2023-05-01 ENCOUNTER — Telehealth: Payer: Self-pay | Admitting: Cardiology

## 2023-05-01 NOTE — Telephone Encounter (Signed)
Results reviewed with pt as per Dr. Revankar's note.  Pt verbalized understanding and had no additional questions. Routed to PCP.  

## 2023-05-01 NOTE — Telephone Encounter (Signed)
Pt would like a c/b regarding Stress test results. Please advise

## 2023-05-05 ENCOUNTER — Other Ambulatory Visit: Payer: Self-pay | Admitting: Cardiology

## 2023-05-05 ENCOUNTER — Other Ambulatory Visit (HOSPITAL_BASED_OUTPATIENT_CLINIC_OR_DEPARTMENT_OTHER): Payer: Self-pay

## 2023-05-05 DIAGNOSIS — E119 Type 2 diabetes mellitus without complications: Secondary | ICD-10-CM

## 2023-05-05 DIAGNOSIS — E785 Hyperlipidemia, unspecified: Secondary | ICD-10-CM

## 2023-05-05 DIAGNOSIS — I10 Essential (primary) hypertension: Secondary | ICD-10-CM

## 2023-05-05 MED ORDER — FENOFIBRATE 145 MG PO TABS
145.0000 mg | ORAL_TABLET | Freq: Every day | ORAL | 0 refills | Status: DC
Start: 2023-05-05 — End: 2023-08-10
  Filled 2023-05-05 – 2023-05-19 (×2): qty 90, 90d supply, fill #0

## 2023-05-05 MED ORDER — ROSUVASTATIN CALCIUM 20 MG PO TABS
20.0000 mg | ORAL_TABLET | Freq: Every day | ORAL | 0 refills | Status: DC
Start: 2023-05-05 — End: 2023-08-10
  Filled 2023-05-05 – 2023-05-19 (×2): qty 90, 90d supply, fill #0

## 2023-05-17 ENCOUNTER — Other Ambulatory Visit (HOSPITAL_BASED_OUTPATIENT_CLINIC_OR_DEPARTMENT_OTHER): Payer: Self-pay

## 2023-05-18 ENCOUNTER — Encounter: Payer: Self-pay | Admitting: Cardiology

## 2023-05-18 ENCOUNTER — Ambulatory Visit: Payer: Commercial Managed Care - PPO | Attending: Cardiology | Admitting: Cardiology

## 2023-05-18 VITALS — BP 134/74 | HR 70 | Ht 75.0 in | Wt 263.0 lb

## 2023-05-18 DIAGNOSIS — G4733 Obstructive sleep apnea (adult) (pediatric): Secondary | ICD-10-CM

## 2023-05-18 DIAGNOSIS — I10 Essential (primary) hypertension: Secondary | ICD-10-CM | POA: Diagnosis not present

## 2023-05-18 DIAGNOSIS — E088 Diabetes mellitus due to underlying condition with unspecified complications: Secondary | ICD-10-CM

## 2023-05-18 DIAGNOSIS — E782 Mixed hyperlipidemia: Secondary | ICD-10-CM | POA: Diagnosis not present

## 2023-05-18 DIAGNOSIS — E66811 Obesity, class 1: Secondary | ICD-10-CM

## 2023-05-18 DIAGNOSIS — I2510 Atherosclerotic heart disease of native coronary artery without angina pectoris: Secondary | ICD-10-CM | POA: Diagnosis not present

## 2023-05-18 NOTE — Patient Instructions (Signed)
Medication Instructions:  Your physician recommends that you continue on your current medications as directed. Please refer to the Current Medication list given to you today.  *If you need a refill on your cardiac medications before your next appointment, please call your pharmacy*   Lab Work: Your physician recommends that you return for lab work in: the next few days for CMP and lipids. You need to have labs done when you are fasting. [facility] lab is located on the 3rd floor, [address]. Hours are Monday - Friday 8 am to 4 pm, closed 11:30 am to 1:00 pm. You do NOT need an appointment.   If you have labs (blood work) drawn today and your tests are completely normal, you will receive your results only by: MyChart Message (if you have MyChart) OR A paper copy in the mail If you have any lab test that is abnormal or we need to change your treatment, we will call you to review the results.   Testing/Procedures: None ordered   Follow-Up: At [facility], you and your health needs are our priority.  As part of our continuing mission to provide you with exceptional heart care, we have created designated Provider Care Teams.  These Care Teams include your primary Cardiologist (physician) and Advanced Practice Providers (APPs -  Physician Assistants and Nurse Practitioners) who all work together to provide you with the care you need, when you need it.  We recommend signing up for the patient portal called "MyChart".  Sign up information is provided on this After Visit Summary.  MyChart is used to connect with patients for Virtual Visits (Telemedicine).  Patients are able to view lab/test results, encounter notes, upcoming appointments, etc.  Non-urgent messages can be sent to your provider as well.   To learn more about what you can do with MyChart, go to [email or web address].    Your next appointment:   9 month(s)  The format for your next appointment:   In Person  Provider:    Rajan Revankar, MD    Other Instructions none  Important Information About Sugar       

## 2023-05-18 NOTE — Progress Notes (Signed)
 Cardiology Office Note:    Date:  05/18/2023   ID:  Wayne Ho., DOB Jun 20, 1970, MRN [id number]  PCP:  Ransom Other, MD  Cardiologist:  Jennifer JONELLE Crape, MD   Referring MD: Ransom Other, MD    ASSESSMENT:    1. Coronary artery calcification   2. Essential hypertension, benign   3. Obstructive sleep apnea   4. Diabetes mellitus due to underlying condition with unspecified complications (HCC)   5. Obesity (BMI 30.0-34.9)   6. Mixed hyperlipidemia    PLAN:    In order of problems listed above:  Coronary artery disease: Secondary prevention stressed with the patient.  Importance of compliance with diet medication stressed any vocalized understanding.  He was advised to walk 30 minutes a day which he already does and he is happy about it.  Asymptomatic. Essential hypertension: Blood pressure is stable and diet was emphasized.  Lifestyle modification urged. Mixed dyslipidemia: On lipid-lowering medications.  Lipids were reviewed and he will be back in the next few days for blood work. Obesity and diabetes mellitus: Lifestyle modification urged I cautioned about diet and he promises to do better. Patient will be seen in follow-up appointment in 6 months or earlier if the patient has any concerns.    Medication Adjustments/Labs and Tests Ordered: Current medicines are reviewed at length with the patient today.  Concerns regarding medicines are outlined above.  No orders of the defined types were placed in this encounter.  No orders of the defined types were placed in this encounter.    No chief complaint on file.    History of Present Illness:    Wayne Ho. is a 53 y.o. male.  Patient has past medical history of coronary artery disease, essential hypertension, mixed dyslipidemia and diabetes mellitus.  He denies any problems at this time and takes care of activities of daily living.  No chest pain orthopnea or PND.  At the time of my evaluation, the patient is  alert awake oriented and in no distress.  He walks 30 minutes on a daily basis without any symptoms.  Past Medical History:  Diagnosis Date   Acute stroke due to ischemia (HCC) 02/09/2022   Chronic chest pain    Coronary artery calcification 10/14/2019   Diabetes mellitus due to underlying condition with unspecified complications (HCC) 02/27/2023   Dyslipidemia    Dyspnea on exertion 02/27/2023   Erectile dysfunction 10/17/2014   Essential hypertension, benign 12/27/2013   GERD (gastroesophageal reflux disease)    Hyperlipidemia 12/27/2013   ASA      Lab Results  Component  Value  Date     CHOL  169  12/12/2013     HDL  31.90*  12/12/2013     LDLCALC  89  11/22/2010     LDLDIRECT  115.1  12/12/2013     TRIG  249.0*  12/12/2013     CHOLHDL  5  12/12/2013         Obesity    Obesity (BMI 30.0-34.9) 02/27/2023   Obstructive sleep apnea 11/02/2007   Never followed through with testing   Rash and nonspecific skin eruption 10/17/2014   Uncontrolled type 2 diabetes mellitus with hyperglycemia, without long-term current use of insulin  (HCC) 07/27/2009   Lantus  35 units. saxliptin-metformin  5-1000mg  daily     Past Surgical History:  Procedure Laterality Date   ANTERIOR CRUCIATE LIGAMENT REPAIR     both knees   CHOLECYSTECTOMY  11/07    Current Medications: Current  Meds  Medication Sig   clopidogrel  (PLAVIX ) 75 MG tablet Take 1 tablet (75 mg total) by mouth daily.   Dulaglutide  (TRULICITY ) 3 MG/0.5ML SOPN Inject one pen under the skin once a week as directed   empagliflozin  (JARDIANCE ) 25 MG TABS tablet Take 1 tablet by mouth once a day   empagliflozin  (JARDIANCE ) 25 MG TABS tablet Take 1 tablet (25 mg total) by mouth daily.   fenofibrate  (TRICOR ) 145 MG tablet Take 1 tablet (145 mg total) by mouth daily.   metFORMIN  (GLUCOPHAGE ) 1000 MG tablet Take 1 tablet (1,000 mg total) by mouth daily with supper.   metFORMIN  (GLUCOPHAGE ) 1000 MG tablet Take 1 tablet (1,000 mg total) by mouth daily  with a meal.   nitroGLYCERIN  (NITROSTAT ) 0.4 MG SL tablet Place 0.4 mg under the tongue every 5 (five) minutes as needed for chest pain.   pantoprazole  (PROTONIX ) 20 MG tablet Take 1 tablet (20 mg total) by mouth daily.   ranolazine  (RANEXA ) 1000 MG SR tablet Take 1 tablet (1,000 mg total) by mouth 2 (two) times daily.   ranolazine  (RANEXA ) 500 MG 12 hr tablet Take 1 tablet (500 mg total) by mouth 2 (two) times daily. Take 500 mg 1 tablet twice daily for 2 weeks then you will get a new RX for 1000 mg tablet to start twice daily.   rosuvastatin  (CRESTOR ) 20 MG tablet Take 1 tablet (20 mg total) by mouth daily.   sildenafil  (VIAGRA ) 100 MG tablet Take 0.5-1 tablets (50-100 mg total) by mouth daily as needed for erectile dysfunction.   tadalafil  (CIALIS ) 20 MG tablet Take 20 mg by mouth daily as needed for erectile dysfunction.   valsartan -hydrochlorothiazide  (DIOVAN -HCT) 320-12.5 MG tablet Take 1 tablet by mouth daily.     Allergies:   Patient has no known allergies.   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  ROS:   Please see the history of present illness.    All other systems reviewed and are negative.  EKGs/Labs/Other Studies Reviewed:    The following studies were reviewed today: I discussed my findings with the patient at length   Recent Labs: 02/27/2023: BUN 22; Creatinine, Ser 1.18; Potassium 5.0; Sodium 137  Recent Lipid Panel    Component Value  Date/Time   CHOL 113 02/10/2022 0352   CHOL 105 10/14/2019 0841   TRIG 131 02/10/2022 0352   HDL 31 (L) 02/10/2022 0352   HDL 27 (L) 10/14/2019 0841   CHOLHDL 3.6 02/10/2022 0352   VLDL 26 02/10/2022 0352   LDLCALC 56 02/10/2022 0352   LDLCALC 52 10/14/2019 0841   LDLDIRECT 115.1 12/12/2013 0816    Physical Exam:    VS:  BP 134/74   Pulse 70   Ht 6' 3 (1.905 m)   Wt 263 lb (119.3 kg)   SpO2 98%   BMI 32.87 kg/m     Wt Readings from Last 3 Encounters:  05/18/23 263 lb (119.3 kg)  04/14/23 264 lb (119.7 kg)  03/30/23 264 lb 1.9 oz (119.8 kg)     GEN: Patient is in no acute distress HEENT: Normal NECK: No JVD; No carotid bruits LYMPHATICS: No lymphadenopathy CARDIAC: Hear sounds regular, 2/6 systolic murmur at the apex. RESPIRATORY:  Clear to auscultation without rales, wheezing or rhonchi  ABDOMEN: Soft, non-tender, non-distended MUSCULOSKELETAL:  No edema; No deformity  SKIN: Warm and dry NEUROLOGIC:  Alert and oriented x 3 PSYCHIATRIC:  Normal affect   Signed, Jennifer JONELLE Crape, MD  05/18/2023 1:37 PM    [facility]

## 2023-05-19 ENCOUNTER — Other Ambulatory Visit (HOSPITAL_BASED_OUTPATIENT_CLINIC_OR_DEPARTMENT_OTHER): Payer: Self-pay

## 2023-06-14 ENCOUNTER — Other Ambulatory Visit (HOSPITAL_BASED_OUTPATIENT_CLINIC_OR_DEPARTMENT_OTHER): Payer: Self-pay

## 2023-06-15 ENCOUNTER — Other Ambulatory Visit (HOSPITAL_BASED_OUTPATIENT_CLINIC_OR_DEPARTMENT_OTHER): Payer: Self-pay

## 2023-06-15 MED ORDER — METFORMIN HCL 1000 MG PO TABS
1000.0000 mg | ORAL_TABLET | Freq: Every day | ORAL | 4 refills | Status: AC
  Filled 2023-06-15: qty 90, 90d supply, fill #0

## 2023-06-21 ENCOUNTER — Ambulatory Visit: Payer: Commercial Managed Care - PPO | Admitting: Cardiology

## 2023-07-03 ENCOUNTER — Other Ambulatory Visit (HOSPITAL_BASED_OUTPATIENT_CLINIC_OR_DEPARTMENT_OTHER): Payer: Self-pay

## 2023-07-26 DIAGNOSIS — E1140 Type 2 diabetes mellitus with diabetic neuropathy, unspecified: Secondary | ICD-10-CM | POA: Diagnosis not present

## 2023-07-26 DIAGNOSIS — Z125 Encounter for screening for malignant neoplasm of prostate: Secondary | ICD-10-CM | POA: Diagnosis not present

## 2023-07-26 DIAGNOSIS — Z0000 Encounter for general adult medical examination without abnormal findings: Secondary | ICD-10-CM | POA: Diagnosis not present

## 2023-07-26 DIAGNOSIS — G4733 Obstructive sleep apnea (adult) (pediatric): Secondary | ICD-10-CM | POA: Diagnosis not present

## 2023-07-26 DIAGNOSIS — I10 Essential (primary) hypertension: Secondary | ICD-10-CM | POA: Diagnosis not present

## 2023-07-26 DIAGNOSIS — I2510 Atherosclerotic heart disease of native coronary artery without angina pectoris: Secondary | ICD-10-CM | POA: Diagnosis not present

## 2023-07-26 DIAGNOSIS — Z1389 Encounter for screening for other disorder: Secondary | ICD-10-CM | POA: Diagnosis not present

## 2023-07-26 DIAGNOSIS — E785 Hyperlipidemia, unspecified: Secondary | ICD-10-CM | POA: Diagnosis not present

## 2023-07-26 DIAGNOSIS — Z8673 Personal history of transient ischemic attack (TIA), and cerebral infarction without residual deficits: Secondary | ICD-10-CM | POA: Diagnosis not present

## 2023-07-26 DIAGNOSIS — K219 Gastro-esophageal reflux disease without esophagitis: Secondary | ICD-10-CM | POA: Diagnosis not present

## 2023-08-09 DIAGNOSIS — T8090XA Unspecified complication following infusion and therapeutic injection, initial encounter: Secondary | ICD-10-CM | POA: Diagnosis not present

## 2023-08-09 DIAGNOSIS — M7989 Other specified soft tissue disorders: Secondary | ICD-10-CM | POA: Diagnosis not present

## 2023-08-09 DIAGNOSIS — L0390 Cellulitis, unspecified: Secondary | ICD-10-CM | POA: Diagnosis not present

## 2023-08-10 ENCOUNTER — Other Ambulatory Visit: Payer: Self-pay | Admitting: Cardiology

## 2023-08-10 ENCOUNTER — Other Ambulatory Visit (HOSPITAL_BASED_OUTPATIENT_CLINIC_OR_DEPARTMENT_OTHER): Payer: Self-pay

## 2023-08-10 DIAGNOSIS — E119 Type 2 diabetes mellitus without complications: Secondary | ICD-10-CM

## 2023-08-10 DIAGNOSIS — I10 Essential (primary) hypertension: Secondary | ICD-10-CM

## 2023-08-10 DIAGNOSIS — E785 Hyperlipidemia, unspecified: Secondary | ICD-10-CM

## 2023-08-10 MED ORDER — ROSUVASTATIN CALCIUM 20 MG PO TABS: 20.0000 mg | ORAL_TABLET | Freq: Every day | ORAL | 2 refills | Status: AC

## 2023-08-10 MED ORDER — FENOFIBRATE 145 MG PO TABS: 145.0000 mg | ORAL_TABLET | Freq: Every day | ORAL | 2 refills | Status: AC

## 2023-08-11 DIAGNOSIS — L0390 Cellulitis, unspecified: Secondary | ICD-10-CM | POA: Diagnosis not present

## 2023-08-11 DIAGNOSIS — T8090XA Unspecified complication following infusion and therapeutic injection, initial encounter: Secondary | ICD-10-CM | POA: Diagnosis not present

## 2023-08-11 DIAGNOSIS — M7989 Other specified soft tissue disorders: Secondary | ICD-10-CM | POA: Diagnosis not present

## 2023-08-31 ENCOUNTER — Other Ambulatory Visit (HOSPITAL_BASED_OUTPATIENT_CLINIC_OR_DEPARTMENT_OTHER): Payer: Self-pay

## 2023-08-31 MED ORDER — RYBELSUS 7 MG PO TABS
7.0000 mg | ORAL_TABLET | Freq: Every day | ORAL | 5 refills | Status: AC
  Filled 2023-08-31 – 2023-11-15 (×8): qty 90, 90d supply, fill #0

## 2023-09-11 ENCOUNTER — Other Ambulatory Visit (HOSPITAL_BASED_OUTPATIENT_CLINIC_OR_DEPARTMENT_OTHER): Payer: Self-pay

## 2023-09-12 ENCOUNTER — Other Ambulatory Visit (HOSPITAL_BASED_OUTPATIENT_CLINIC_OR_DEPARTMENT_OTHER): Payer: Self-pay

## 2023-09-21 ENCOUNTER — Other Ambulatory Visit (HOSPITAL_BASED_OUTPATIENT_CLINIC_OR_DEPARTMENT_OTHER): Payer: Self-pay

## 2023-09-22 ENCOUNTER — Other Ambulatory Visit (HOSPITAL_BASED_OUTPATIENT_CLINIC_OR_DEPARTMENT_OTHER): Payer: Self-pay

## 2023-09-25 ENCOUNTER — Other Ambulatory Visit (HOSPITAL_BASED_OUTPATIENT_CLINIC_OR_DEPARTMENT_OTHER): Payer: Self-pay

## 2023-10-06 ENCOUNTER — Other Ambulatory Visit (HOSPITAL_BASED_OUTPATIENT_CLINIC_OR_DEPARTMENT_OTHER): Payer: Self-pay

## 2023-10-09 ENCOUNTER — Other Ambulatory Visit (HOSPITAL_BASED_OUTPATIENT_CLINIC_OR_DEPARTMENT_OTHER): Payer: Self-pay

## 2023-10-19 ENCOUNTER — Other Ambulatory Visit (HOSPITAL_BASED_OUTPATIENT_CLINIC_OR_DEPARTMENT_OTHER): Payer: Self-pay

## 2023-10-20 ENCOUNTER — Other Ambulatory Visit (HOSPITAL_BASED_OUTPATIENT_CLINIC_OR_DEPARTMENT_OTHER): Payer: Self-pay

## 2023-10-31 ENCOUNTER — Other Ambulatory Visit (HOSPITAL_BASED_OUTPATIENT_CLINIC_OR_DEPARTMENT_OTHER): Payer: Self-pay

## 2023-11-01 ENCOUNTER — Other Ambulatory Visit (HOSPITAL_BASED_OUTPATIENT_CLINIC_OR_DEPARTMENT_OTHER): Payer: Self-pay

## 2023-11-14 ENCOUNTER — Other Ambulatory Visit (HOSPITAL_BASED_OUTPATIENT_CLINIC_OR_DEPARTMENT_OTHER): Payer: Self-pay

## 2023-11-15 ENCOUNTER — Other Ambulatory Visit (HOSPITAL_BASED_OUTPATIENT_CLINIC_OR_DEPARTMENT_OTHER): Payer: Self-pay

## 2023-11-28 ENCOUNTER — Other Ambulatory Visit (HOSPITAL_BASED_OUTPATIENT_CLINIC_OR_DEPARTMENT_OTHER): Payer: Self-pay

## 2024-02-05 DIAGNOSIS — I10 Essential (primary) hypertension: Secondary | ICD-10-CM | POA: Diagnosis not present

## 2024-02-05 DIAGNOSIS — E113413 Type 2 diabetes mellitus with severe nonproliferative diabetic retinopathy with macular edema, bilateral: Secondary | ICD-10-CM | POA: Diagnosis not present

## 2024-02-05 DIAGNOSIS — G4733 Obstructive sleep apnea (adult) (pediatric): Secondary | ICD-10-CM | POA: Diagnosis not present

## 2024-02-05 DIAGNOSIS — I2510 Atherosclerotic heart disease of native coronary artery without angina pectoris: Secondary | ICD-10-CM | POA: Diagnosis not present

## 2024-02-05 DIAGNOSIS — E785 Hyperlipidemia, unspecified: Secondary | ICD-10-CM | POA: Diagnosis not present

## 2024-02-05 DIAGNOSIS — E1165 Type 2 diabetes mellitus with hyperglycemia: Secondary | ICD-10-CM | POA: Diagnosis not present

## 2024-02-05 DIAGNOSIS — Z8673 Personal history of transient ischemic attack (TIA), and cerebral infarction without residual deficits: Secondary | ICD-10-CM | POA: Diagnosis not present

## 2024-02-07 ENCOUNTER — Other Ambulatory Visit (HOSPITAL_BASED_OUTPATIENT_CLINIC_OR_DEPARTMENT_OTHER): Payer: Self-pay

## 2024-02-07 ENCOUNTER — Other Ambulatory Visit: Payer: Self-pay

## 2024-02-07 MED ORDER — FENOFIBRATE 145 MG PO TABS
145.0000 mg | ORAL_TABLET | Freq: Every day | ORAL | 5 refills | Status: AC
  Filled 2024-02-07: qty 90, 90d supply, fill #0

## 2024-02-07 MED ORDER — JARDIANCE 25 MG PO TABS
25.0000 mg | ORAL_TABLET | Freq: Every day | ORAL | 5 refills | Status: AC
  Filled 2024-02-07 – 2024-02-20 (×2): qty 90, 90d supply, fill #0

## 2024-02-07 MED ORDER — ROSUVASTATIN CALCIUM 20 MG PO TABS
20.0000 mg | ORAL_TABLET | Freq: Every day | ORAL | 5 refills | Status: AC
  Filled 2024-02-07: qty 90, 90d supply, fill #0

## 2024-02-07 MED ORDER — CLOPIDOGREL BISULFATE 75 MG PO TABS
75.0000 mg | ORAL_TABLET | Freq: Every day | ORAL | 5 refills | Status: AC
  Filled 2024-02-07: qty 90, 90d supply, fill #0

## 2024-02-07 MED ORDER — METFORMIN HCL 1000 MG PO TABS
1000.0000 mg | ORAL_TABLET | Freq: Two times a day (BID) | ORAL | 5 refills | Status: AC
  Filled 2024-02-07 – 2024-02-21 (×2): qty 180, 90d supply, fill #0

## 2024-02-07 MED ORDER — VALSARTAN-HYDROCHLOROTHIAZIDE 320-12.5 MG PO TABS
1.0000 | ORAL_TABLET | Freq: Every day | ORAL | 5 refills | Status: AC
  Filled 2024-02-07 – 2024-02-21 (×2): qty 90, 90d supply, fill #0

## 2024-02-20 ENCOUNTER — Other Ambulatory Visit (HOSPITAL_BASED_OUTPATIENT_CLINIC_OR_DEPARTMENT_OTHER): Payer: Self-pay

## 2024-02-21 ENCOUNTER — Other Ambulatory Visit (HOSPITAL_BASED_OUTPATIENT_CLINIC_OR_DEPARTMENT_OTHER): Payer: Self-pay

## 2024-03-04 ENCOUNTER — Other Ambulatory Visit (HOSPITAL_BASED_OUTPATIENT_CLINIC_OR_DEPARTMENT_OTHER): Payer: Self-pay
